# Patient Record
Sex: Male | Born: 1940 | Race: White | Hispanic: No | Marital: Married | Smoking: Never smoker
Health system: Southern US, Community
[De-identification: ages and names within clinical notes are randomized; demographics above are authoritative.]

## PROBLEM LIST (undated history)

## (undated) DIAGNOSIS — I4891 Unspecified atrial fibrillation: Secondary | ICD-10-CM

## (undated) DIAGNOSIS — E785 Hyperlipidemia, unspecified: Secondary | ICD-10-CM

## (undated) DIAGNOSIS — I10 Essential (primary) hypertension: Secondary | ICD-10-CM

## (undated) HISTORY — DX: Essential (primary) hypertension: I10

## (undated) HISTORY — PX: ATRIAL FIBRILLATION ABLATION: EP1191

## (undated) HISTORY — DX: Hyperlipidemia, unspecified: E78.5

## (undated) HISTORY — DX: Unspecified atrial fibrillation: I48.91

---

## 1993-12-02 HISTORY — PX: OTHER SURGICAL HISTORY: SHX169

## 1998-04-17 ENCOUNTER — Ambulatory Visit (HOSPITAL_COMMUNITY): Admission: RE | Admit: 1998-04-17 | Discharge: 1998-04-17 | Payer: Self-pay | Admitting: Internal Medicine

## 1998-05-10 ENCOUNTER — Ambulatory Visit (HOSPITAL_COMMUNITY): Admission: RE | Admit: 1998-05-10 | Discharge: 1998-05-10 | Payer: Self-pay | Admitting: Neurosurgery

## 1998-06-27 ENCOUNTER — Ambulatory Visit (HOSPITAL_COMMUNITY): Admission: RE | Admit: 1998-06-27 | Discharge: 1998-06-27 | Payer: Self-pay | Admitting: Neurosurgery

## 2003-03-25 ENCOUNTER — Ambulatory Visit (HOSPITAL_COMMUNITY): Admission: RE | Admit: 2003-03-25 | Discharge: 2003-03-25 | Payer: Self-pay | Admitting: Gastroenterology

## 2005-12-02 HISTORY — PX: ABLATION: SHX5711

## 2006-01-08 ENCOUNTER — Ambulatory Visit (HOSPITAL_BASED_OUTPATIENT_CLINIC_OR_DEPARTMENT_OTHER): Admission: RE | Admit: 2006-01-08 | Discharge: 2006-01-08 | Payer: Self-pay | Admitting: Surgery

## 2007-11-05 ENCOUNTER — Ambulatory Visit (HOSPITAL_COMMUNITY): Admission: RE | Admit: 2007-11-05 | Discharge: 2007-11-05 | Payer: Self-pay | Admitting: Cardiovascular Disease

## 2011-04-19 NOTE — Op Note (Signed)
   NAME:  Jeffery Garrett, Jeffery Garrett                        ACCOUNT NO.:  [id number]   MEDICAL RECORD NO.:  [id number]                   PATIENT TYPE:  AMB   LOCATION:  ENDO                                 FACILITY:  [facility]   PHYSICIAN:  John C. Madilyn Fireman, M.D.                 DATE OF BIRTH:  1940-12-09   DATE OF PROCEDURE:  03/25/2003  DATE OF DISCHARGE:                                 OPERATIVE REPORT   PROCEDURE:  Colonoscopy.   INDICATIONS FOR PROCEDURE:  History of adenomatous colon polyps with last  colonoscopy five years ago.   DESCRIPTION OF PROCEDURE:  The patient was placed in the left lateral  decubitus position and placed on the pulse monitor with continuous low-flow  oxygen delivered by nasal cannula.  He was sedated with 87.5 mcg IV fentanyl  and 8 mg IV Versed.  The Olympus video colonoscope was inserted into the  rectum and advanced to the cecum, confirmed by transillumination at  McBurney's point and visualization of the ileocecal valve and appendiceal  orifice.  The prep was excellent.  There was diffuse appearance of melanosis  coli which was fairly intense throughout the colon.  Otherwise, the cecum,  ascending, transverse, descending, and sigmoid colon all appeared normal  with no masses, polyps, diverticula, or other mucosal abnormalities.  The  rectum appeared normal, and retroflexed view of the anus revealed no obvious  internal hemorrhoids.  The colonoscope was then withdrawn, and the patient  returned to the recovery room in stable condition.  He tolerated the  procedure well, and there were no immediate complications.   IMPRESSION:  1. Melanosis coli.  2. Otherwise, normal colonoscopy.   PLAN:  Repeat colonoscopy in five years.                                               John C. Madilyn Fireman, M.D.    JCH/MEDQ  D:  03/25/2003  T:  03/25/2003  Job:  [id number]   cc:   Geoffry Paradise, M.D.  [address]  [place]  [place] [address]  Fax: [phone]

## 2011-04-19 NOTE — Op Note (Signed)
Jeffery Garrett, Jeffery Garrett              ACCOUNT NO.:  [id number]   MEDICAL RECORD NO.:  [id number]          PATIENT TYPE:  AMB   LOCATION:  DSC                          FACILITY:  [facility]   PHYSICIAN:  Currie Paris, M.D.DATE OF BIRTH:  12/22/1940   DATE OF PROCEDURE:  01/08/2006  DATE OF DISCHARGE:                                 OPERATIVE REPORT   Office Medical Record Number CCS [phone].   PREOP DIAGNOSIS:  His right inguinal hernia.   PREOPERATIVE DIAGNOSIS:  His right inguinal hernia--direct.   OPERATION:  Repair right inguinal hernia with mesh.   SURGEON:  Currie Paris, M.D.   ANESTHESIA:  MAC.   CLINICAL HISTORY:  This is an active 70 year old male who has developed a  right inguinal hernia that he desired to have repaired.   DESCRIPTION OF PROCEDURE:  The patient was seen in the holding area and he  had no further questions. He and I both marked the right inguinal area as  the operative site. He was taken to the operating room and given IV  sedation. The groin area on the right was clipped, prepped, and draped. The  time-out was done.   I infiltrated a combination of 1% Xylocaine with epinephrine mixed equally  with 0.5% Marcaine for local. I infiltrated the along the incision line; and  then at right angles to it, to make a field block; and then about a  fingerbreadth above-and-medial to the anterior superior iliac spine  subfascially.   The incision was made deepened through the external oblique aponeurosis with  bleeders either coagulated or tied with 4-0 Vicryl. Additional local was  infiltrated as we got deeper layers.  A self-retaining retractor was placed  and the external oblique identified, and opened in the line of its fibers.  The ilioinguinal nerve was noted running on top of the cord; and the cord  was dissected up off the underlying structures and surrounded with a Penrose  drain keeping the nerve with it. There was a direct defect with  preperitoneal fat protruding through. It was not very broad and reduced  easily. The cord was opened and checked; and there was no indirect sac.   I chose to imbricate the transversalis so with a running 2-0 Prolene to have  a layer of closure over the floor prior to placing a mesh patch on. The mesh  patch was then placed with it cut laterally to go around the cord. It was  sutured in starting at the pubic tubercle and then running laterally with a  running 2-0 Prolene. It was intact medially well up under the external  oblique and onto the internal oblique with several sutures of Prolene. The  tails were crossed and went well laterally to reconstruct the deep ring,  leaving adequate room for the cord structures.   Additional local was infiltrated and made sure everything was dry. The  incision was then closed in layers with 3-0 Vicryl to close the external  oblique over the cord, 3-0 Vicryl on Scarpa's, and 4-0 Monocryl plus  Dermabond for subcuticular and skin.  The patient  tolerated the procedure  well. There no operative complications. All counts were correct.      Currie Paris, M.D.  Electronically Signed     CJS/MEDQ  D:  01/08/2006  T:  01/08/2006  Job:  [id number]   cc:   Geoffry Paradise, M.D.  Fax: [phone]

## 2012-01-20 DIAGNOSIS — Z125 Encounter for screening for malignant neoplasm of prostate: Secondary | ICD-10-CM | POA: Diagnosis not present

## 2012-01-20 DIAGNOSIS — E039 Hypothyroidism, unspecified: Secondary | ICD-10-CM | POA: Diagnosis not present

## 2012-01-20 DIAGNOSIS — Z0000 Encounter for general adult medical examination without abnormal findings: Secondary | ICD-10-CM | POA: Diagnosis not present

## 2012-01-20 DIAGNOSIS — I10 Essential (primary) hypertension: Secondary | ICD-10-CM | POA: Diagnosis not present

## 2012-01-20 DIAGNOSIS — E785 Hyperlipidemia, unspecified: Secondary | ICD-10-CM | POA: Diagnosis not present

## 2012-01-27 DIAGNOSIS — Z125 Encounter for screening for malignant neoplasm of prostate: Secondary | ICD-10-CM | POA: Diagnosis not present

## 2012-01-27 DIAGNOSIS — I10 Essential (primary) hypertension: Secondary | ICD-10-CM | POA: Diagnosis not present

## 2012-01-27 DIAGNOSIS — E039 Hypothyroidism, unspecified: Secondary | ICD-10-CM | POA: Diagnosis not present

## 2012-01-27 DIAGNOSIS — Z0000 Encounter for general adult medical examination without abnormal findings: Secondary | ICD-10-CM | POA: Diagnosis not present

## 2012-01-27 DIAGNOSIS — I491 Atrial premature depolarization: Secondary | ICD-10-CM | POA: Diagnosis not present

## 2012-01-28 DIAGNOSIS — Z1212 Encounter for screening for malignant neoplasm of rectum: Secondary | ICD-10-CM | POA: Diagnosis not present

## 2012-04-23 DIAGNOSIS — C44721 Squamous cell carcinoma of skin of unspecified lower limb, including hip: Secondary | ICD-10-CM | POA: Diagnosis not present

## 2012-04-23 DIAGNOSIS — Z85828 Personal history of other malignant neoplasm of skin: Secondary | ICD-10-CM | POA: Diagnosis not present

## 2012-04-23 DIAGNOSIS — L821 Other seborrheic keratosis: Secondary | ICD-10-CM | POA: Diagnosis not present

## 2012-06-01 DIAGNOSIS — C44721 Squamous cell carcinoma of skin of unspecified lower limb, including hip: Secondary | ICD-10-CM | POA: Diagnosis not present

## 2012-08-19 DIAGNOSIS — L259 Unspecified contact dermatitis, unspecified cause: Secondary | ICD-10-CM | POA: Diagnosis not present

## 2012-08-19 DIAGNOSIS — D235 Other benign neoplasm of skin of trunk: Secondary | ICD-10-CM | POA: Diagnosis not present

## 2012-08-19 DIAGNOSIS — Z85828 Personal history of other malignant neoplasm of skin: Secondary | ICD-10-CM | POA: Diagnosis not present

## 2012-08-28 DIAGNOSIS — Z23 Encounter for immunization: Secondary | ICD-10-CM | POA: Diagnosis not present

## 2012-12-11 DIAGNOSIS — H18219 Corneal edema secondary to contact lens, unspecified eye: Secondary | ICD-10-CM | POA: Diagnosis not present

## 2012-12-11 DIAGNOSIS — H16009 Unspecified corneal ulcer, unspecified eye: Secondary | ICD-10-CM | POA: Diagnosis not present

## 2012-12-18 DIAGNOSIS — H18219 Corneal edema secondary to contact lens, unspecified eye: Secondary | ICD-10-CM | POA: Diagnosis not present

## 2012-12-18 DIAGNOSIS — H16009 Unspecified corneal ulcer, unspecified eye: Secondary | ICD-10-CM | POA: Diagnosis not present

## 2013-02-01 DIAGNOSIS — E785 Hyperlipidemia, unspecified: Secondary | ICD-10-CM | POA: Diagnosis not present

## 2013-02-01 DIAGNOSIS — E039 Hypothyroidism, unspecified: Secondary | ICD-10-CM | POA: Diagnosis not present

## 2013-05-05 DIAGNOSIS — I10 Essential (primary) hypertension: Secondary | ICD-10-CM | POA: Diagnosis not present

## 2013-05-05 DIAGNOSIS — Z8679 Personal history of other diseases of the circulatory system: Secondary | ICD-10-CM | POA: Diagnosis not present

## 2013-05-05 DIAGNOSIS — E785 Hyperlipidemia, unspecified: Secondary | ICD-10-CM | POA: Diagnosis not present

## 2013-05-18 DIAGNOSIS — Z86010 Personal history of colonic polyps: Secondary | ICD-10-CM | POA: Diagnosis not present

## 2013-05-18 DIAGNOSIS — D126 Benign neoplasm of colon, unspecified: Secondary | ICD-10-CM | POA: Diagnosis not present

## 2013-05-18 DIAGNOSIS — K5730 Diverticulosis of large intestine without perforation or abscess without bleeding: Secondary | ICD-10-CM | POA: Diagnosis not present

## 2013-05-18 DIAGNOSIS — K6389 Other specified diseases of intestine: Secondary | ICD-10-CM | POA: Diagnosis not present

## 2013-05-18 DIAGNOSIS — Z09 Encounter for follow-up examination after completed treatment for conditions other than malignant neoplasm: Secondary | ICD-10-CM | POA: Diagnosis not present

## 2013-05-24 DIAGNOSIS — H04129 Dry eye syndrome of unspecified lacrimal gland: Secondary | ICD-10-CM | POA: Diagnosis not present

## 2013-09-11 DIAGNOSIS — Z23 Encounter for immunization: Secondary | ICD-10-CM | POA: Diagnosis not present

## 2013-11-03 DIAGNOSIS — Z85828 Personal history of other malignant neoplasm of skin: Secondary | ICD-10-CM | POA: Diagnosis not present

## 2013-11-03 DIAGNOSIS — L708 Other acne: Secondary | ICD-10-CM | POA: Diagnosis not present

## 2013-11-03 DIAGNOSIS — D235 Other benign neoplasm of skin of trunk: Secondary | ICD-10-CM | POA: Diagnosis not present

## 2014-01-31 DIAGNOSIS — E039 Hypothyroidism, unspecified: Secondary | ICD-10-CM | POA: Diagnosis not present

## 2014-01-31 DIAGNOSIS — Z125 Encounter for screening for malignant neoplasm of prostate: Secondary | ICD-10-CM | POA: Diagnosis not present

## 2014-01-31 DIAGNOSIS — I10 Essential (primary) hypertension: Secondary | ICD-10-CM | POA: Diagnosis not present

## 2014-01-31 DIAGNOSIS — E785 Hyperlipidemia, unspecified: Secondary | ICD-10-CM | POA: Diagnosis not present

## 2014-02-07 DIAGNOSIS — I10 Essential (primary) hypertension: Secondary | ICD-10-CM | POA: Diagnosis not present

## 2014-02-07 DIAGNOSIS — E039 Hypothyroidism, unspecified: Secondary | ICD-10-CM | POA: Diagnosis not present

## 2014-02-07 DIAGNOSIS — Z125 Encounter for screening for malignant neoplasm of prostate: Secondary | ICD-10-CM | POA: Diagnosis not present

## 2014-02-07 DIAGNOSIS — E785 Hyperlipidemia, unspecified: Secondary | ICD-10-CM | POA: Diagnosis not present

## 2014-02-07 DIAGNOSIS — Z0000 Encounter for general adult medical examination without abnormal findings: Secondary | ICD-10-CM | POA: Diagnosis not present

## 2014-02-07 DIAGNOSIS — IMO0002 Reserved for concepts with insufficient information to code with codable children: Secondary | ICD-10-CM | POA: Diagnosis not present

## 2014-02-07 DIAGNOSIS — Z23 Encounter for immunization: Secondary | ICD-10-CM | POA: Diagnosis not present

## 2014-02-07 DIAGNOSIS — Z1331 Encounter for screening for depression: Secondary | ICD-10-CM | POA: Diagnosis not present

## 2014-02-08 DIAGNOSIS — Z1212 Encounter for screening for malignant neoplasm of rectum: Secondary | ICD-10-CM | POA: Diagnosis not present

## 2014-04-26 DIAGNOSIS — D235 Other benign neoplasm of skin of trunk: Secondary | ICD-10-CM | POA: Diagnosis not present

## 2014-04-26 DIAGNOSIS — L678 Other hair color and hair shaft abnormalities: Secondary | ICD-10-CM | POA: Diagnosis not present

## 2014-04-26 DIAGNOSIS — L738 Other specified follicular disorders: Secondary | ICD-10-CM | POA: Diagnosis not present

## 2014-04-26 DIAGNOSIS — B86 Scabies: Secondary | ICD-10-CM | POA: Diagnosis not present

## 2014-05-04 DIAGNOSIS — Z8679 Personal history of other diseases of the circulatory system: Secondary | ICD-10-CM | POA: Diagnosis not present

## 2014-05-04 DIAGNOSIS — I10 Essential (primary) hypertension: Secondary | ICD-10-CM | POA: Diagnosis not present

## 2014-05-04 DIAGNOSIS — E785 Hyperlipidemia, unspecified: Secondary | ICD-10-CM | POA: Diagnosis not present

## 2014-05-04 DIAGNOSIS — Z9889 Other specified postprocedural states: Secondary | ICD-10-CM | POA: Diagnosis not present

## 2014-06-27 DIAGNOSIS — I10 Essential (primary) hypertension: Secondary | ICD-10-CM | POA: Diagnosis not present

## 2014-06-27 DIAGNOSIS — L259 Unspecified contact dermatitis, unspecified cause: Secondary | ICD-10-CM | POA: Diagnosis not present

## 2014-06-27 DIAGNOSIS — IMO0002 Reserved for concepts with insufficient information to code with codable children: Secondary | ICD-10-CM | POA: Diagnosis not present

## 2014-07-22 DIAGNOSIS — H2510 Age-related nuclear cataract, unspecified eye: Secondary | ICD-10-CM | POA: Diagnosis not present

## 2014-07-27 DIAGNOSIS — L738 Other specified follicular disorders: Secondary | ICD-10-CM | POA: Diagnosis not present

## 2014-07-27 DIAGNOSIS — B86 Scabies: Secondary | ICD-10-CM | POA: Diagnosis not present

## 2014-07-27 DIAGNOSIS — D485 Neoplasm of uncertain behavior of skin: Secondary | ICD-10-CM | POA: Diagnosis not present

## 2014-07-27 DIAGNOSIS — L678 Other hair color and hair shaft abnormalities: Secondary | ICD-10-CM | POA: Diagnosis not present

## 2014-08-24 DIAGNOSIS — I10 Essential (primary) hypertension: Secondary | ICD-10-CM | POA: Diagnosis not present

## 2014-08-29 ENCOUNTER — Ambulatory Visit: Payer: Medicare Other | Admitting: Neurology

## 2014-08-30 ENCOUNTER — Encounter (INDEPENDENT_AMBULATORY_CARE_PROVIDER_SITE_OTHER): Payer: Self-pay

## 2014-08-30 ENCOUNTER — Encounter: Payer: Self-pay | Admitting: Neurology

## 2014-08-30 ENCOUNTER — Ambulatory Visit (INDEPENDENT_AMBULATORY_CARE_PROVIDER_SITE_OTHER): Payer: Medicare Other | Admitting: Neurology

## 2014-08-30 VITALS — BP 129/73 | HR 73 | Temp 98.2°F | Ht 66.0 in | Wt 147.0 lb

## 2014-08-30 DIAGNOSIS — I4891 Unspecified atrial fibrillation: Secondary | ICD-10-CM

## 2014-08-30 DIAGNOSIS — I480 Paroxysmal atrial fibrillation: Secondary | ICD-10-CM

## 2014-08-30 DIAGNOSIS — G252 Other specified forms of tremor: Secondary | ICD-10-CM

## 2014-08-30 DIAGNOSIS — G250 Essential tremor: Secondary | ICD-10-CM | POA: Diagnosis not present

## 2014-08-30 MED ORDER — PRIMIDONE 50 MG PO TABS: ORAL_TABLET | ORAL | Status: DC

## 2014-08-30 NOTE — Progress Notes (Signed)
Subjective:    Patient ID: Jeffery Garrett is a 73 y.o. male.  HPI    Star Age, MD, PhD [facility] [address] [place], [place] [address]  Dear Ulice Dash,   I saw your patient, Jeffery Garrett, upon your kind request in my neurologic clinic today for initial consultation of his tremors. The patient is unaccompanied today. As you know, Jeffery Garrett is a very friendly 73 year old right-handed gentleman with an underlying medical history of hypertension, hyperlipidemia, and atrial fibrillation status post ablation in 2008 and on chronic flecainide therapy, who reports a 10-15 year history of bilateral upper extremity tremors, R more than L. He had seen his PCP about this several years ago, and was told, he had no parkinsonism and he has not tried medications for the tremor. It has been mildly progressive, worse with stress or after exercise.   His Past Medical History Is Significant For: Past Medical History  Diagnosis Date  . Hypertension     His Past Surgical History Is Significant For: Past Surgical History  Procedure Laterality Date  . Neck fusion  1995  . Ablation  2007    His Family History Is Significant For: History reviewed. No pertinent family history.  His Social History Is Significant For: History   Social History  . Marital Status: Married    Spouse Name: N/A    Number of Children: N/A  . Years of Education: N/A   Social History Main Topics  . Smoking status: Never Smoker   . Smokeless tobacco: Never Used  . Alcohol Use: No  . Drug Use: No  . Sexual Activity: None   Other Topics Concern  . None   Social History Narrative  . None    His Allergies Are:  No Known Allergies:   His Current Medications Are:  Outpatient Encounter Prescriptions as of 08/30/2014  Medication Sig  . amLODipine (NORVASC) 5 MG tablet   . aspirin 81 MG tablet Take 81 mg by mouth daily.  Marland Kitchen doxycycline (VIBRAMYCIN) 100 MG capsule   .  flecainide (TAMBOCOR) 50 MG tablet   . permethrin (ELIMITE) 5 % cream   . rosuvastatin (CRESTOR) 5 MG tablet Take 5 mg by mouth daily.  Marland Kitchen SYNTHROID 100 MCG tablet   . [DISCONTINUED] triamcinolone cream (KENALOG) 0.1 %   :   Review of Systems:  Out of a complete 14 point review of systems, all are reviewed and negative with the exception of these symptoms as listed below:   Review of Systems  HENT:       Ringing in ears  Musculoskeletal:       Joint pain,cramps  Neurological: Positive for tremors.    Objective:  Neurologic Exam  Physical Exam  Assessment:   Physical Examination:   Filed Vitals:   08/30/14 1333  BP: 129/73  Pulse: 73  Temp: 98.2 F (36.8 C)    General Examination: The patient is a very pleasant 72 y.o. male in no acute distress. He appears well-developed and well-nourished and well groomed.   HEENT: Normocephalic, atraumatic, pupils are equal, round and reactive to light and accommodation. He has mild b/l cataracts. Funduscopic exam is normal with sharp disc margins noted. Extraocular tracking is good without limitation to gaze excursion or nystagmus noted. Normal smooth pursuit is noted. Hearing is grossly intact. Tympanic membranes are clear bilaterally. Face is symmetric with normal facial animation and normal facial sensation. Speech is clear with no dysarthria noted. There is  no hypophonia. There is no lip, neck/head, jaw or voice tremor. Neck is supple with full range of passive and active motion. There are no carotid bruits on auscultation. Oropharynx exam reveals: mild mouth dryness, adequate dental hygiene and mild airway crowding, due to redundant soft palate. Mallampati is class II. Tongue protrudes centrally and palate elevates symmetrically.   Chest: Clear to auscultation without wheezing, rhonchi or crackles noted.  Heart: S1+S2+0, regular and normal without murmurs, rubs or gallops noted.   Abdomen: Soft, non-tender and non-distended with  normal bowel sounds appreciated on auscultation.  Extremities: There is no pitting edema in the distal lower extremities bilaterally. Pedal pulses are intact.  Skin: Warm and dry without trophic changes noted. There are no varicose veins.  Musculoskeletal: exam reveals no obvious joint deformities, tenderness or joint swelling or erythema.   Neurologically:  Mental status: The patient is awake, alert and oriented in all 4 spheres. His immediate and remote memory, attention, language skills and fund of knowledge are appropriate. There is no evidence of aphasia, agnosia, apraxia or anomia. Speech is clear with normal prosody and enunciation. Thought process is linear. Mood is normal and affect is normal.  Cranial nerves II - XII are as described above under HEENT exam. In addition: shoulder shrug is normal with equal shoulder height noted. Motor exam: Normal bulk, strength and tone is noted. There is no drift, or rebound. There is no resting tremor. There is a bilateral upper extremity postural and action tremor, which is minimal in degree on the L and mild on the R. There tremor frequency is fairly fast and the amplitude is small. On Archimedes spiral drawing there is mild tremulousness noted. Handwriting is mildly to moderately tremulous, but legible. There is no evidence of micrographia.  Romberg is negative. Reflexes are 2+ throughout. Babinski: Toes are flexor bilaterally. Fine motor skills and coordination: intact with normal finger taps, normal hand movements, normal rapid alternating patting, normal foot taps and normal foot agility.  Cerebellar testing: No dysmetria or intention tremor on finger to nose testing. Heel to shin is unremarkable bilaterally. There is no truncal or gait ataxia.  Sensory exam: intact to light touch, pinprick, vibration, temperature sense in the upper and lower extremities.  Gait, station and balance: He stands easily. No veering to one side is noted. No leaning to one  side is noted. Posture is age-appropriate and stance is narrow based. Gait shows normal stride length and normal pace. No problems turning are noted. He turns en bloc. Tandem walk is unremarkable. Intact toe and heel stance is noted.               Assessment and Plan:    In summary, Jeffery Garrett is a very pleasant 73 y.o.-year old male with an underlying medical history of hypertension, hyperlipidemia, and atrial fibrillation status post ablation in 2008 and on chronic flecainide therapy, who reports a 10-15 year history of bilateral upper extremity tremors, R more than L. 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 He is reassured, that he has no other focal neurologic findings. He has overall mild tremor.  I had a long chat with the patient about my findings and the diagnosis of ET, the prognosis and treatment options. We talked about medical treatments and non-pharmacological approaches. We talked about maintaining a healthy lifestyle in general. I encouraged the patient to eat healthy, exercise daily and keep well hydrated, to keep a scheduled  bedtime and wake time routine, to not skip any meals and eat healthy snacks in between meals and to have protein with every meal. He is advised about tremor triggers: any kind of tremor may be exacerbated by anxiety, anger, nervousness, excitement, dehydration, sleep deprivation, by caffeine, and low blood sugar values or blood sugar fluctuations. Some medications, especially some antidepressants and lithium can cause or exacerbate tremors. Tremors may temporarily calm down or subside with the use of a benzodiazepine such as Valium or related medications and with alcohol. Drinking alcohol is not an approved treatment or appropriate treatment for tremor control and long-term use of benzodiazepines such as Valium, lorazepam, alprazolam, or clonazepam can cause habit formation, physical and psychological  addiction. I do not think we need to do further testing. His thyroid function is followed by his PCP.   As far as medications are concerned, I recommended the following at this time: He is advised that symptomatic treatment with a beta blocker may lower his BP and pulse, so I would avoid it. However, we may be able to get symptomatic improvement with a low dose of Mysoline. To that end I have asked him to start prednisone 50 mg strength half a pill at bedtime each day for a week and then gradually increase it up to 2 pills each night. We talked about potential side effects in particular sedation, and balance problems. Usually it is a very well-tolerated medication I advised him. I provided him with a prescription as well as written instructions.  I answered all his questions today and the patient was in agreement with the above outlined plan. I would like to see the patient back in 6 months, sooner if the need arises and encouraged him to call with any interim questions, concerns, problems or updates and refill requests.  Thank you very much for allowing me to participate in the care of this nice patient. If I can be of any further assistance to you please do not hesitate to call me at [phone].  Sincerely,   Star Age, MD, PhD

## 2014-08-30 NOTE — Patient Instructions (Addendum)
  Please remember, that any kind of tremor may be exacerbated by anxiety, anger, nervousness, excitement, dehydration, sleep deprivation, by caffeine, and low blood sugar values or blood sugar fluctuations. Some medications, especially some antidepressants and lithium can cause or exacerbate tremors. Tremors may temporarily calm down her subside with the use of a benzodiazepine such as Valium or related medications and with alcohol. Be aware however that drinking alcohol is not an approved treatment or appropriate treatment for tremor control and long-term use of benzodiazepines such as Valium, lorazepam, alprazolam, or clonazepam can cause habit formation, physical and psychological addiction.  For your tremor, we will try Mysoline (primidone) 50 mg strength: Take 1/2 pill each bedtime for 1 week, then 1 pill each bedtime for 1 week, then 1 1/2 pills each bedtime for 1 week, then 2 pills each bedtime thereafter. Common side effects reported are: Sleepiness, drowsiness, balance problems, confusion, and GI related symptoms.

## 2014-09-12 DIAGNOSIS — Z23 Encounter for immunization: Secondary | ICD-10-CM | POA: Diagnosis not present

## 2014-09-14 DIAGNOSIS — I4891 Unspecified atrial fibrillation: Secondary | ICD-10-CM | POA: Diagnosis not present

## 2014-09-14 DIAGNOSIS — I10 Essential (primary) hypertension: Secondary | ICD-10-CM | POA: Diagnosis not present

## 2014-09-19 DIAGNOSIS — I10 Essential (primary) hypertension: Secondary | ICD-10-CM | POA: Diagnosis not present

## 2014-09-21 DIAGNOSIS — D485 Neoplasm of uncertain behavior of skin: Secondary | ICD-10-CM | POA: Diagnosis not present

## 2014-09-21 DIAGNOSIS — C44619 Basal cell carcinoma of skin of left upper limb, including shoulder: Secondary | ICD-10-CM | POA: Diagnosis not present

## 2014-09-21 DIAGNOSIS — L309 Dermatitis, unspecified: Secondary | ICD-10-CM | POA: Diagnosis not present

## 2014-09-21 DIAGNOSIS — L308 Other specified dermatitis: Secondary | ICD-10-CM | POA: Diagnosis not present

## 2014-10-13 ENCOUNTER — Ambulatory Visit (INDEPENDENT_AMBULATORY_CARE_PROVIDER_SITE_OTHER): Payer: Medicare Other | Admitting: Family Medicine

## 2014-10-13 VITALS — BP 108/62 | HR 60 | Temp 98.3°F | Resp 18 | Ht 66.0 in | Wt 147.0 lb

## 2014-10-13 DIAGNOSIS — M79605 Pain in left leg: Secondary | ICD-10-CM

## 2014-10-13 DIAGNOSIS — S81812A Laceration without foreign body, left lower leg, initial encounter: Secondary | ICD-10-CM

## 2014-10-13 NOTE — Progress Notes (Signed)
   Subjective:    Patient ID: Jeffery Garrett, male    DOB: 1941-06-10, 73 y.o.   MRN: [id number]  HPI Pt presents to clinic with a laceration to his left shin that occurred when the ladder he was standing on slipped and his foot slipped between the rungs and he got a cut.  His tetanus is updated through his PCP.  He did not hit his head and his back is slightly sore but he does not want [place] to deal with that today because he plans to take tylenol and motrin for his stiffness.  Review of Systems     Objective:   Physical Exam  Constitutional: He is oriented to person, place, and time. He appears well-developed and well-nourished.  BP 108/62 mmHg  Pulse 60  Temp(Src) 98.3 F (36.8 C) (Oral)  Resp 18  Ht 5\' 6"  (1.676 m)  Wt 147 lb (66.679 kg)  BMI 23.74 kg/m2  SpO2 96%   HENT:  Head: Normocephalic and atraumatic.  Right Ear: External ear normal.  Left Ear: External ear normal.  Pulmonary/Chest: Effort normal.  Musculoskeletal:       Left lower leg: He exhibits no tenderness and no bony tenderness.  Good ankle ROM and strength on the left side.   Neurological: He is alert and oriented to person, place, and time.  Skin: Skin is warm and dry.     Psychiatric: He has a normal mood and affect. His behavior is normal. Judgment and thought content normal.  Vitals reviewed.  Procedure:  Consent obtained.  Local anesthesia with 2% lido.  Wound cleaned and explored.  (See media for photo).  There is a nerve visible but no damage seen.  The muscle sheath is not disrupted and no tendon anatomy is seen.  Wound closed with 4-0 Ethilon #15 (?) sutures (simple and horizontal).  The superficial aspect of the wound is a skin tear and closed with steri-strips.  There are some over laying skin tears on the skin flap that are also held together with steri-strips. Drsg applied.       Assessment & Plan:  Laceration of leg excluding thigh, left, initial encounter Wound closed.  Due to the location  and the activity level of the patient he will keep the stitches for 14d.  He will f/u if there are problems before then.  He will restricted activity for the next 4-5 days due to the tension on the wound to allow for some healing.   Windell Hummingbird PA-C  [facility] 10/13/2014 6:35 PM

## 2014-10-13 NOTE — Patient Instructions (Signed)
WOUND CARE Please return in 14 days to have your stitches/staples removed or sooner if you have concerns.  Keep area clean and dry for 24 hours. Do not remove bandage, if applied.  After 24 hours, remove bandage and wash wound gently with mild soap and warm water. Reapply a new bandage after cleaning wound, if directed.  Continue daily cleansing with soap and water until stitches/staples are removed.  Do not apply any ointments or creams to the wound while stitches/staples are in place, as this may cause delayed healing.  Notify the office if you experience any of the following signs of infection: Swelling, redness, pus drainage, streaking, fever >101.0 F  Notify the office if you experience excessive bleeding that does not stop after 15-20 minutes of constant, firm pressure.  

## 2014-10-15 NOTE — Progress Notes (Signed)
Patient discussed and examined with Ms. Weber. Agree with assessment and plan of care per her note.

## 2015-01-11 DIAGNOSIS — Z6823 Body mass index (BMI) 23.0-23.9, adult: Secondary | ICD-10-CM | POA: Diagnosis not present

## 2015-01-11 DIAGNOSIS — M545 Low back pain: Secondary | ICD-10-CM | POA: Diagnosis not present

## 2015-01-11 DIAGNOSIS — M4856XA Collapsed vertebra, not elsewhere classified, lumbar region, initial encounter for fracture: Secondary | ICD-10-CM | POA: Diagnosis not present

## 2015-01-11 DIAGNOSIS — M5136 Other intervertebral disc degeneration, lumbar region: Secondary | ICD-10-CM | POA: Diagnosis not present

## 2015-01-11 DIAGNOSIS — N644 Mastodynia: Secondary | ICD-10-CM | POA: Diagnosis not present

## 2015-01-11 DIAGNOSIS — M4317 Spondylolisthesis, lumbosacral region: Secondary | ICD-10-CM | POA: Diagnosis not present

## 2015-02-08 DIAGNOSIS — I10 Essential (primary) hypertension: Secondary | ICD-10-CM | POA: Diagnosis not present

## 2015-02-08 DIAGNOSIS — Z125 Encounter for screening for malignant neoplasm of prostate: Secondary | ICD-10-CM | POA: Diagnosis not present

## 2015-02-08 DIAGNOSIS — E785 Hyperlipidemia, unspecified: Secondary | ICD-10-CM | POA: Diagnosis not present

## 2015-02-08 DIAGNOSIS — E039 Hypothyroidism, unspecified: Secondary | ICD-10-CM | POA: Diagnosis not present

## 2015-02-13 DIAGNOSIS — N183 Chronic kidney disease, stage 3 (moderate): Secondary | ICD-10-CM | POA: Diagnosis not present

## 2015-02-13 DIAGNOSIS — E039 Hypothyroidism, unspecified: Secondary | ICD-10-CM | POA: Diagnosis not present

## 2015-02-13 DIAGNOSIS — Z1389 Encounter for screening for other disorder: Secondary | ICD-10-CM | POA: Diagnosis not present

## 2015-02-13 DIAGNOSIS — I10 Essential (primary) hypertension: Secondary | ICD-10-CM | POA: Diagnosis not present

## 2015-02-13 DIAGNOSIS — I129 Hypertensive chronic kidney disease with stage 1 through stage 4 chronic kidney disease, or unspecified chronic kidney disease: Secondary | ICD-10-CM | POA: Diagnosis not present

## 2015-02-13 DIAGNOSIS — E785 Hyperlipidemia, unspecified: Secondary | ICD-10-CM | POA: Diagnosis not present

## 2015-02-13 DIAGNOSIS — M5380 Other specified dorsopathies, site unspecified: Secondary | ICD-10-CM | POA: Diagnosis not present

## 2015-02-13 DIAGNOSIS — Z0000 Encounter for general adult medical examination without abnormal findings: Secondary | ICD-10-CM | POA: Diagnosis not present

## 2015-02-13 DIAGNOSIS — Z6822 Body mass index (BMI) 22.0-22.9, adult: Secondary | ICD-10-CM | POA: Diagnosis not present

## 2015-02-13 DIAGNOSIS — I491 Atrial premature depolarization: Secondary | ICD-10-CM | POA: Diagnosis not present

## 2015-02-13 DIAGNOSIS — Z125 Encounter for screening for malignant neoplasm of prostate: Secondary | ICD-10-CM | POA: Diagnosis not present

## 2015-02-14 ENCOUNTER — Telehealth: Payer: Self-pay

## 2015-02-14 NOTE — Telephone Encounter (Signed)
Left messages on both numbers to call back and reschedule appt on 3/29.

## 2015-02-15 DIAGNOSIS — Z1212 Encounter for screening for malignant neoplasm of rectum: Secondary | ICD-10-CM | POA: Diagnosis not present

## 2015-02-17 NOTE — Telephone Encounter (Signed)
Left message again on both numbers to call back and reschedule appt

## 2015-02-20 DIAGNOSIS — H04123 Dry eye syndrome of bilateral lacrimal glands: Secondary | ICD-10-CM | POA: Diagnosis not present

## 2015-02-28 ENCOUNTER — Ambulatory Visit: Payer: Medicare Other | Admitting: Neurology

## 2015-05-03 DIAGNOSIS — E785 Hyperlipidemia, unspecified: Secondary | ICD-10-CM | POA: Diagnosis not present

## 2015-05-03 DIAGNOSIS — Z8679 Personal history of other diseases of the circulatory system: Secondary | ICD-10-CM | POA: Diagnosis not present

## 2015-05-03 DIAGNOSIS — I10 Essential (primary) hypertension: Secondary | ICD-10-CM | POA: Diagnosis not present

## 2015-05-19 DIAGNOSIS — L739 Follicular disorder, unspecified: Secondary | ICD-10-CM | POA: Diagnosis not present

## 2015-09-13 DIAGNOSIS — I10 Essential (primary) hypertension: Secondary | ICD-10-CM | POA: Diagnosis not present

## 2015-09-13 DIAGNOSIS — M5380 Other specified dorsopathies, site unspecified: Secondary | ICD-10-CM | POA: Diagnosis not present

## 2015-09-13 DIAGNOSIS — Z6822 Body mass index (BMI) 22.0-22.9, adult: Secondary | ICD-10-CM | POA: Diagnosis not present

## 2015-09-13 DIAGNOSIS — I129 Hypertensive chronic kidney disease with stage 1 through stage 4 chronic kidney disease, or unspecified chronic kidney disease: Secondary | ICD-10-CM | POA: Diagnosis not present

## 2015-09-13 DIAGNOSIS — Z23 Encounter for immunization: Secondary | ICD-10-CM | POA: Diagnosis not present

## 2015-09-13 DIAGNOSIS — E039 Hypothyroidism, unspecified: Secondary | ICD-10-CM | POA: Diagnosis not present

## 2015-09-13 DIAGNOSIS — N183 Chronic kidney disease, stage 3 (moderate): Secondary | ICD-10-CM | POA: Diagnosis not present

## 2015-09-13 DIAGNOSIS — E785 Hyperlipidemia, unspecified: Secondary | ICD-10-CM | POA: Diagnosis not present

## 2015-11-10 DIAGNOSIS — E785 Hyperlipidemia, unspecified: Secondary | ICD-10-CM | POA: Diagnosis not present

## 2015-11-10 DIAGNOSIS — I10 Essential (primary) hypertension: Secondary | ICD-10-CM | POA: Diagnosis not present

## 2015-11-10 DIAGNOSIS — Z8679 Personal history of other diseases of the circulatory system: Secondary | ICD-10-CM | POA: Diagnosis not present

## 2015-11-14 DIAGNOSIS — R011 Cardiac murmur, unspecified: Secondary | ICD-10-CM | POA: Diagnosis not present

## 2015-11-14 DIAGNOSIS — I10 Essential (primary) hypertension: Secondary | ICD-10-CM | POA: Diagnosis not present

## 2015-12-18 DIAGNOSIS — L821 Other seborrheic keratosis: Secondary | ICD-10-CM | POA: Diagnosis not present

## 2015-12-18 DIAGNOSIS — D485 Neoplasm of uncertain behavior of skin: Secondary | ICD-10-CM | POA: Diagnosis not present

## 2015-12-18 DIAGNOSIS — L570 Actinic keratosis: Secondary | ICD-10-CM | POA: Diagnosis not present

## 2016-02-29 DIAGNOSIS — E784 Other hyperlipidemia: Secondary | ICD-10-CM | POA: Diagnosis not present

## 2016-02-29 DIAGNOSIS — I10 Essential (primary) hypertension: Secondary | ICD-10-CM | POA: Diagnosis not present

## 2016-02-29 DIAGNOSIS — E038 Other specified hypothyroidism: Secondary | ICD-10-CM | POA: Diagnosis not present

## 2016-02-29 DIAGNOSIS — Z125 Encounter for screening for malignant neoplasm of prostate: Secondary | ICD-10-CM | POA: Diagnosis not present

## 2016-03-08 DIAGNOSIS — Z6822 Body mass index (BMI) 22.0-22.9, adult: Secondary | ICD-10-CM | POA: Diagnosis not present

## 2016-03-08 DIAGNOSIS — Z0000 Encounter for general adult medical examination without abnormal findings: Secondary | ICD-10-CM | POA: Diagnosis not present

## 2016-03-08 DIAGNOSIS — I491 Atrial premature depolarization: Secondary | ICD-10-CM | POA: Diagnosis not present

## 2016-03-08 DIAGNOSIS — I10 Essential (primary) hypertension: Secondary | ICD-10-CM | POA: Diagnosis not present

## 2016-03-08 DIAGNOSIS — Z125 Encounter for screening for malignant neoplasm of prostate: Secondary | ICD-10-CM | POA: Diagnosis not present

## 2016-03-08 DIAGNOSIS — M545 Low back pain: Secondary | ICD-10-CM | POA: Diagnosis not present

## 2016-03-08 DIAGNOSIS — Z1389 Encounter for screening for other disorder: Secondary | ICD-10-CM | POA: Diagnosis not present

## 2016-03-08 DIAGNOSIS — E038 Other specified hypothyroidism: Secondary | ICD-10-CM | POA: Diagnosis not present

## 2016-03-08 DIAGNOSIS — N183 Chronic kidney disease, stage 3 (moderate): Secondary | ICD-10-CM | POA: Diagnosis not present

## 2016-03-08 DIAGNOSIS — M5380 Other specified dorsopathies, site unspecified: Secondary | ICD-10-CM | POA: Diagnosis not present

## 2016-03-08 DIAGNOSIS — E784 Other hyperlipidemia: Secondary | ICD-10-CM | POA: Diagnosis not present

## 2016-03-08 DIAGNOSIS — I129 Hypertensive chronic kidney disease with stage 1 through stage 4 chronic kidney disease, or unspecified chronic kidney disease: Secondary | ICD-10-CM | POA: Diagnosis not present

## 2016-03-22 DIAGNOSIS — Z1212 Encounter for screening for malignant neoplasm of rectum: Secondary | ICD-10-CM | POA: Diagnosis not present

## 2016-04-17 DIAGNOSIS — M25561 Pain in right knee: Secondary | ICD-10-CM | POA: Diagnosis not present

## 2016-04-17 DIAGNOSIS — M25562 Pain in left knee: Secondary | ICD-10-CM | POA: Diagnosis not present

## 2016-04-17 DIAGNOSIS — M170 Bilateral primary osteoarthritis of knee: Secondary | ICD-10-CM | POA: Diagnosis not present

## 2016-04-23 DIAGNOSIS — M25562 Pain in left knee: Secondary | ICD-10-CM | POA: Diagnosis not present

## 2016-04-23 DIAGNOSIS — M1712 Unilateral primary osteoarthritis, left knee: Secondary | ICD-10-CM | POA: Diagnosis not present

## 2016-04-24 DIAGNOSIS — M1711 Unilateral primary osteoarthritis, right knee: Secondary | ICD-10-CM | POA: Diagnosis not present

## 2016-04-24 DIAGNOSIS — M25561 Pain in right knee: Secondary | ICD-10-CM | POA: Diagnosis not present

## 2016-05-01 DIAGNOSIS — M25562 Pain in left knee: Secondary | ICD-10-CM | POA: Diagnosis not present

## 2016-05-01 DIAGNOSIS — M1712 Unilateral primary osteoarthritis, left knee: Secondary | ICD-10-CM | POA: Diagnosis not present

## 2016-05-02 DIAGNOSIS — M25561 Pain in right knee: Secondary | ICD-10-CM | POA: Diagnosis not present

## 2016-05-02 DIAGNOSIS — M1711 Unilateral primary osteoarthritis, right knee: Secondary | ICD-10-CM | POA: Diagnosis not present

## 2016-05-07 DIAGNOSIS — M1712 Unilateral primary osteoarthritis, left knee: Secondary | ICD-10-CM | POA: Diagnosis not present

## 2016-05-07 DIAGNOSIS — M25562 Pain in left knee: Secondary | ICD-10-CM | POA: Diagnosis not present

## 2016-05-08 DIAGNOSIS — M25561 Pain in right knee: Secondary | ICD-10-CM | POA: Diagnosis not present

## 2016-05-08 DIAGNOSIS — M1711 Unilateral primary osteoarthritis, right knee: Secondary | ICD-10-CM | POA: Diagnosis not present

## 2016-05-15 DIAGNOSIS — M25561 Pain in right knee: Secondary | ICD-10-CM | POA: Diagnosis not present

## 2016-05-15 DIAGNOSIS — I10 Essential (primary) hypertension: Secondary | ICD-10-CM | POA: Diagnosis not present

## 2016-05-15 DIAGNOSIS — Z8679 Personal history of other diseases of the circulatory system: Secondary | ICD-10-CM | POA: Diagnosis not present

## 2016-05-15 DIAGNOSIS — E785 Hyperlipidemia, unspecified: Secondary | ICD-10-CM | POA: Diagnosis not present

## 2016-05-15 DIAGNOSIS — M170 Bilateral primary osteoarthritis of knee: Secondary | ICD-10-CM | POA: Diagnosis not present

## 2016-05-15 DIAGNOSIS — M25562 Pain in left knee: Secondary | ICD-10-CM | POA: Diagnosis not present

## 2016-05-29 DIAGNOSIS — Z6823 Body mass index (BMI) 23.0-23.9, adult: Secondary | ICD-10-CM | POA: Diagnosis not present

## 2016-05-29 DIAGNOSIS — A6920 Lyme disease, unspecified: Secondary | ICD-10-CM | POA: Diagnosis not present

## 2016-06-10 DIAGNOSIS — C44722 Squamous cell carcinoma of skin of right lower limb, including hip: Secondary | ICD-10-CM | POA: Diagnosis not present

## 2016-09-16 DIAGNOSIS — M545 Low back pain: Secondary | ICD-10-CM | POA: Diagnosis not present

## 2016-09-16 DIAGNOSIS — I10 Essential (primary) hypertension: Secondary | ICD-10-CM | POA: Diagnosis not present

## 2016-09-16 DIAGNOSIS — N183 Chronic kidney disease, stage 3 (moderate): Secondary | ICD-10-CM | POA: Diagnosis not present

## 2016-09-16 DIAGNOSIS — I129 Hypertensive chronic kidney disease with stage 1 through stage 4 chronic kidney disease, or unspecified chronic kidney disease: Secondary | ICD-10-CM | POA: Diagnosis not present

## 2016-09-16 DIAGNOSIS — Z6822 Body mass index (BMI) 22.0-22.9, adult: Secondary | ICD-10-CM | POA: Diagnosis not present

## 2016-09-16 DIAGNOSIS — R600 Localized edema: Secondary | ICD-10-CM | POA: Diagnosis not present

## 2016-09-16 DIAGNOSIS — E784 Other hyperlipidemia: Secondary | ICD-10-CM | POA: Diagnosis not present

## 2016-09-16 DIAGNOSIS — I491 Atrial premature depolarization: Secondary | ICD-10-CM | POA: Diagnosis not present

## 2016-09-16 DIAGNOSIS — E038 Other specified hypothyroidism: Secondary | ICD-10-CM | POA: Diagnosis not present

## 2016-09-16 DIAGNOSIS — Z23 Encounter for immunization: Secondary | ICD-10-CM | POA: Diagnosis not present

## 2016-09-16 DIAGNOSIS — M5380 Other specified dorsopathies, site unspecified: Secondary | ICD-10-CM | POA: Diagnosis not present

## 2016-11-14 DIAGNOSIS — M170 Bilateral primary osteoarthritis of knee: Secondary | ICD-10-CM | POA: Diagnosis not present

## 2016-11-14 DIAGNOSIS — M25562 Pain in left knee: Secondary | ICD-10-CM | POA: Diagnosis not present

## 2016-11-14 DIAGNOSIS — M25561 Pain in right knee: Secondary | ICD-10-CM | POA: Diagnosis not present

## 2016-11-20 DIAGNOSIS — M1711 Unilateral primary osteoarthritis, right knee: Secondary | ICD-10-CM | POA: Diagnosis not present

## 2016-11-20 DIAGNOSIS — M25561 Pain in right knee: Secondary | ICD-10-CM | POA: Diagnosis not present

## 2016-11-21 DIAGNOSIS — M25562 Pain in left knee: Secondary | ICD-10-CM | POA: Diagnosis not present

## 2016-11-21 DIAGNOSIS — M1712 Unilateral primary osteoarthritis, left knee: Secondary | ICD-10-CM | POA: Diagnosis not present

## 2016-12-03 DIAGNOSIS — M170 Bilateral primary osteoarthritis of knee: Secondary | ICD-10-CM | POA: Diagnosis not present

## 2016-12-03 DIAGNOSIS — M25562 Pain in left knee: Secondary | ICD-10-CM | POA: Diagnosis not present

## 2016-12-03 DIAGNOSIS — M25561 Pain in right knee: Secondary | ICD-10-CM | POA: Diagnosis not present

## 2016-12-09 DIAGNOSIS — E785 Hyperlipidemia, unspecified: Secondary | ICD-10-CM | POA: Diagnosis not present

## 2016-12-09 DIAGNOSIS — Z8679 Personal history of other diseases of the circulatory system: Secondary | ICD-10-CM | POA: Diagnosis not present

## 2016-12-09 DIAGNOSIS — I10 Essential (primary) hypertension: Secondary | ICD-10-CM | POA: Diagnosis not present

## 2017-03-12 DIAGNOSIS — Z125 Encounter for screening for malignant neoplasm of prostate: Secondary | ICD-10-CM | POA: Diagnosis not present

## 2017-03-12 DIAGNOSIS — I10 Essential (primary) hypertension: Secondary | ICD-10-CM | POA: Diagnosis not present

## 2017-03-12 DIAGNOSIS — E038 Other specified hypothyroidism: Secondary | ICD-10-CM | POA: Diagnosis not present

## 2017-03-12 DIAGNOSIS — E784 Other hyperlipidemia: Secondary | ICD-10-CM | POA: Diagnosis not present

## 2017-03-19 DIAGNOSIS — N183 Chronic kidney disease, stage 3 (moderate): Secondary | ICD-10-CM | POA: Diagnosis not present

## 2017-03-19 DIAGNOSIS — R600 Localized edema: Secondary | ICD-10-CM | POA: Diagnosis not present

## 2017-03-19 DIAGNOSIS — I491 Atrial premature depolarization: Secondary | ICD-10-CM | POA: Diagnosis not present

## 2017-03-19 DIAGNOSIS — I10 Essential (primary) hypertension: Secondary | ICD-10-CM | POA: Diagnosis not present

## 2017-03-19 DIAGNOSIS — I129 Hypertensive chronic kidney disease with stage 1 through stage 4 chronic kidney disease, or unspecified chronic kidney disease: Secondary | ICD-10-CM | POA: Diagnosis not present

## 2017-03-19 DIAGNOSIS — Z6821 Body mass index (BMI) 21.0-21.9, adult: Secondary | ICD-10-CM | POA: Diagnosis not present

## 2017-03-19 DIAGNOSIS — M545 Low back pain: Secondary | ICD-10-CM | POA: Diagnosis not present

## 2017-03-19 DIAGNOSIS — E784 Other hyperlipidemia: Secondary | ICD-10-CM | POA: Diagnosis not present

## 2017-03-19 DIAGNOSIS — Z1389 Encounter for screening for other disorder: Secondary | ICD-10-CM | POA: Diagnosis not present

## 2017-03-19 DIAGNOSIS — Z0000 Encounter for general adult medical examination without abnormal findings: Secondary | ICD-10-CM | POA: Diagnosis not present

## 2017-03-19 DIAGNOSIS — E038 Other specified hypothyroidism: Secondary | ICD-10-CM | POA: Diagnosis not present

## 2017-04-07 DIAGNOSIS — M170 Bilateral primary osteoarthritis of knee: Secondary | ICD-10-CM | POA: Diagnosis not present

## 2017-04-07 DIAGNOSIS — M25561 Pain in right knee: Secondary | ICD-10-CM | POA: Diagnosis not present

## 2017-04-07 DIAGNOSIS — M1711 Unilateral primary osteoarthritis, right knee: Secondary | ICD-10-CM | POA: Diagnosis not present

## 2017-04-07 DIAGNOSIS — Z789 Other specified health status: Secondary | ICD-10-CM | POA: Diagnosis not present

## 2017-04-07 DIAGNOSIS — M25562 Pain in left knee: Secondary | ICD-10-CM | POA: Diagnosis not present

## 2017-04-07 DIAGNOSIS — Z9229 Personal history of other drug therapy: Secondary | ICD-10-CM | POA: Diagnosis not present

## 2017-04-14 DIAGNOSIS — Z789 Other specified health status: Secondary | ICD-10-CM | POA: Diagnosis not present

## 2017-04-14 DIAGNOSIS — M25561 Pain in right knee: Secondary | ICD-10-CM | POA: Diagnosis not present

## 2017-04-14 DIAGNOSIS — M1711 Unilateral primary osteoarthritis, right knee: Secondary | ICD-10-CM | POA: Diagnosis not present

## 2017-04-21 DIAGNOSIS — Z9229 Personal history of other drug therapy: Secondary | ICD-10-CM | POA: Diagnosis not present

## 2017-04-21 DIAGNOSIS — Z789 Other specified health status: Secondary | ICD-10-CM | POA: Diagnosis not present

## 2017-04-21 DIAGNOSIS — M25561 Pain in right knee: Secondary | ICD-10-CM | POA: Diagnosis not present

## 2017-04-21 DIAGNOSIS — M1711 Unilateral primary osteoarthritis, right knee: Secondary | ICD-10-CM | POA: Diagnosis not present

## 2017-05-05 DIAGNOSIS — M25561 Pain in right knee: Secondary | ICD-10-CM | POA: Diagnosis not present

## 2017-05-05 DIAGNOSIS — Z789 Other specified health status: Secondary | ICD-10-CM | POA: Diagnosis not present

## 2017-05-05 DIAGNOSIS — Z9229 Personal history of other drug therapy: Secondary | ICD-10-CM | POA: Diagnosis not present

## 2017-05-05 DIAGNOSIS — M1711 Unilateral primary osteoarthritis, right knee: Secondary | ICD-10-CM | POA: Diagnosis not present

## 2017-07-31 DIAGNOSIS — Z8679 Personal history of other diseases of the circulatory system: Secondary | ICD-10-CM | POA: Diagnosis not present

## 2017-07-31 DIAGNOSIS — Z9889 Other specified postprocedural states: Secondary | ICD-10-CM | POA: Diagnosis not present

## 2017-07-31 DIAGNOSIS — E785 Hyperlipidemia, unspecified: Secondary | ICD-10-CM | POA: Diagnosis not present

## 2017-07-31 DIAGNOSIS — I10 Essential (primary) hypertension: Secondary | ICD-10-CM | POA: Diagnosis not present

## 2017-08-27 DIAGNOSIS — L508 Other urticaria: Secondary | ICD-10-CM | POA: Diagnosis not present

## 2017-08-27 DIAGNOSIS — D225 Melanocytic nevi of trunk: Secondary | ICD-10-CM | POA: Diagnosis not present

## 2017-08-27 DIAGNOSIS — Z1283 Encounter for screening for malignant neoplasm of skin: Secondary | ICD-10-CM | POA: Diagnosis not present

## 2017-08-27 DIAGNOSIS — T7840XA Allergy, unspecified, initial encounter: Secondary | ICD-10-CM | POA: Diagnosis not present

## 2017-09-03 DIAGNOSIS — C44311 Basal cell carcinoma of skin of nose: Secondary | ICD-10-CM | POA: Diagnosis not present

## 2017-09-22 DIAGNOSIS — Z6821 Body mass index (BMI) 21.0-21.9, adult: Secondary | ICD-10-CM | POA: Diagnosis not present

## 2017-09-22 DIAGNOSIS — M5380 Other specified dorsopathies, site unspecified: Secondary | ICD-10-CM | POA: Diagnosis not present

## 2017-09-22 DIAGNOSIS — E7849 Other hyperlipidemia: Secondary | ICD-10-CM | POA: Diagnosis not present

## 2017-09-22 DIAGNOSIS — N183 Chronic kidney disease, stage 3 (moderate): Secondary | ICD-10-CM | POA: Diagnosis not present

## 2017-09-22 DIAGNOSIS — M5416 Radiculopathy, lumbar region: Secondary | ICD-10-CM | POA: Diagnosis not present

## 2017-09-22 DIAGNOSIS — I129 Hypertensive chronic kidney disease with stage 1 through stage 4 chronic kidney disease, or unspecified chronic kidney disease: Secondary | ICD-10-CM | POA: Diagnosis not present

## 2017-09-22 DIAGNOSIS — E038 Other specified hypothyroidism: Secondary | ICD-10-CM | POA: Diagnosis not present

## 2017-09-22 DIAGNOSIS — R600 Localized edema: Secondary | ICD-10-CM | POA: Diagnosis not present

## 2017-09-22 DIAGNOSIS — I491 Atrial premature depolarization: Secondary | ICD-10-CM | POA: Diagnosis not present

## 2017-09-22 DIAGNOSIS — Z23 Encounter for immunization: Secondary | ICD-10-CM | POA: Diagnosis not present

## 2017-09-22 DIAGNOSIS — I10 Essential (primary) hypertension: Secondary | ICD-10-CM | POA: Diagnosis not present

## 2017-09-22 DIAGNOSIS — M545 Low back pain: Secondary | ICD-10-CM | POA: Diagnosis not present

## 2017-10-08 DIAGNOSIS — C44311 Basal cell carcinoma of skin of nose: Secondary | ICD-10-CM | POA: Diagnosis not present

## 2017-10-08 DIAGNOSIS — B880 Other acariasis: Secondary | ICD-10-CM | POA: Diagnosis not present

## 2017-12-30 DIAGNOSIS — M1711 Unilateral primary osteoarthritis, right knee: Secondary | ICD-10-CM | POA: Diagnosis not present

## 2017-12-30 DIAGNOSIS — M25562 Pain in left knee: Secondary | ICD-10-CM | POA: Diagnosis not present

## 2017-12-30 DIAGNOSIS — Z9229 Personal history of other drug therapy: Secondary | ICD-10-CM | POA: Diagnosis not present

## 2017-12-30 DIAGNOSIS — M25561 Pain in right knee: Secondary | ICD-10-CM | POA: Diagnosis not present

## 2017-12-30 DIAGNOSIS — M170 Bilateral primary osteoarthritis of knee: Secondary | ICD-10-CM | POA: Diagnosis not present

## 2018-01-06 DIAGNOSIS — M25561 Pain in right knee: Secondary | ICD-10-CM | POA: Diagnosis not present

## 2018-01-06 DIAGNOSIS — M1711 Unilateral primary osteoarthritis, right knee: Secondary | ICD-10-CM | POA: Diagnosis not present

## 2018-01-13 DIAGNOSIS — M1711 Unilateral primary osteoarthritis, right knee: Secondary | ICD-10-CM | POA: Diagnosis not present

## 2018-01-13 DIAGNOSIS — M25561 Pain in right knee: Secondary | ICD-10-CM | POA: Diagnosis not present

## 2018-01-20 DIAGNOSIS — M1711 Unilateral primary osteoarthritis, right knee: Secondary | ICD-10-CM | POA: Diagnosis not present

## 2018-01-20 DIAGNOSIS — M25561 Pain in right knee: Secondary | ICD-10-CM | POA: Diagnosis not present

## 2018-01-28 DIAGNOSIS — H04123 Dry eye syndrome of bilateral lacrimal glands: Secondary | ICD-10-CM | POA: Diagnosis not present

## 2018-03-18 DIAGNOSIS — E038 Other specified hypothyroidism: Secondary | ICD-10-CM | POA: Diagnosis not present

## 2018-03-18 DIAGNOSIS — I10 Essential (primary) hypertension: Secondary | ICD-10-CM | POA: Diagnosis not present

## 2018-03-18 DIAGNOSIS — Z125 Encounter for screening for malignant neoplasm of prostate: Secondary | ICD-10-CM | POA: Diagnosis not present

## 2018-03-18 DIAGNOSIS — R82998 Other abnormal findings in urine: Secondary | ICD-10-CM | POA: Diagnosis not present

## 2018-03-31 DIAGNOSIS — E038 Other specified hypothyroidism: Secondary | ICD-10-CM | POA: Diagnosis not present

## 2018-03-31 DIAGNOSIS — N183 Chronic kidney disease, stage 3 (moderate): Secondary | ICD-10-CM | POA: Diagnosis not present

## 2018-03-31 DIAGNOSIS — I129 Hypertensive chronic kidney disease with stage 1 through stage 4 chronic kidney disease, or unspecified chronic kidney disease: Secondary | ICD-10-CM | POA: Diagnosis not present

## 2018-03-31 DIAGNOSIS — M545 Low back pain: Secondary | ICD-10-CM | POA: Diagnosis not present

## 2018-03-31 DIAGNOSIS — I491 Atrial premature depolarization: Secondary | ICD-10-CM | POA: Diagnosis not present

## 2018-03-31 DIAGNOSIS — Z0000 Encounter for general adult medical examination without abnormal findings: Secondary | ICD-10-CM | POA: Diagnosis not present

## 2018-03-31 DIAGNOSIS — Z1389 Encounter for screening for other disorder: Secondary | ICD-10-CM | POA: Diagnosis not present

## 2018-03-31 DIAGNOSIS — R600 Localized edema: Secondary | ICD-10-CM | POA: Diagnosis not present

## 2018-03-31 DIAGNOSIS — M5416 Radiculopathy, lumbar region: Secondary | ICD-10-CM | POA: Diagnosis not present

## 2018-03-31 DIAGNOSIS — I10 Essential (primary) hypertension: Secondary | ICD-10-CM | POA: Diagnosis not present

## 2018-03-31 DIAGNOSIS — Z6823 Body mass index (BMI) 23.0-23.9, adult: Secondary | ICD-10-CM | POA: Diagnosis not present

## 2018-03-31 DIAGNOSIS — E7849 Other hyperlipidemia: Secondary | ICD-10-CM | POA: Diagnosis not present

## 2018-04-03 DIAGNOSIS — Z1212 Encounter for screening for malignant neoplasm of rectum: Secondary | ICD-10-CM | POA: Diagnosis not present

## 2018-07-08 DIAGNOSIS — M25561 Pain in right knee: Secondary | ICD-10-CM | POA: Diagnosis not present

## 2018-07-08 DIAGNOSIS — M1711 Unilateral primary osteoarthritis, right knee: Secondary | ICD-10-CM | POA: Diagnosis not present

## 2018-07-15 DIAGNOSIS — M1711 Unilateral primary osteoarthritis, right knee: Secondary | ICD-10-CM | POA: Diagnosis not present

## 2018-07-15 DIAGNOSIS — M25561 Pain in right knee: Secondary | ICD-10-CM | POA: Diagnosis not present

## 2018-07-29 DIAGNOSIS — I358 Other nonrheumatic aortic valve disorders: Secondary | ICD-10-CM | POA: Diagnosis not present

## 2018-07-29 DIAGNOSIS — I10 Essential (primary) hypertension: Secondary | ICD-10-CM | POA: Diagnosis not present

## 2018-07-29 DIAGNOSIS — Z9889 Other specified postprocedural states: Secondary | ICD-10-CM | POA: Diagnosis not present

## 2018-07-29 DIAGNOSIS — Z8679 Personal history of other diseases of the circulatory system: Secondary | ICD-10-CM | POA: Diagnosis not present

## 2018-08-28 DIAGNOSIS — Z86010 Personal history of colonic polyps: Secondary | ICD-10-CM | POA: Diagnosis not present

## 2018-08-28 DIAGNOSIS — D123 Benign neoplasm of transverse colon: Secondary | ICD-10-CM | POA: Diagnosis not present

## 2018-08-28 DIAGNOSIS — D122 Benign neoplasm of ascending colon: Secondary | ICD-10-CM | POA: Diagnosis not present

## 2018-09-01 DIAGNOSIS — D122 Benign neoplasm of ascending colon: Secondary | ICD-10-CM | POA: Diagnosis not present

## 2018-09-01 DIAGNOSIS — D123 Benign neoplasm of transverse colon: Secondary | ICD-10-CM | POA: Diagnosis not present

## 2018-09-03 DIAGNOSIS — Z23 Encounter for immunization: Secondary | ICD-10-CM | POA: Diagnosis not present

## 2018-09-28 DIAGNOSIS — I10 Essential (primary) hypertension: Secondary | ICD-10-CM | POA: Diagnosis not present

## 2018-09-28 DIAGNOSIS — E7849 Other hyperlipidemia: Secondary | ICD-10-CM | POA: Diagnosis not present

## 2018-09-28 DIAGNOSIS — E038 Other specified hypothyroidism: Secondary | ICD-10-CM | POA: Diagnosis not present

## 2018-09-28 DIAGNOSIS — N183 Chronic kidney disease, stage 3 (moderate): Secondary | ICD-10-CM | POA: Diagnosis not present

## 2018-09-28 DIAGNOSIS — I129 Hypertensive chronic kidney disease with stage 1 through stage 4 chronic kidney disease, or unspecified chronic kidney disease: Secondary | ICD-10-CM | POA: Diagnosis not present

## 2018-09-28 DIAGNOSIS — M5380 Other specified dorsopathies, site unspecified: Secondary | ICD-10-CM | POA: Diagnosis not present

## 2018-09-28 DIAGNOSIS — I491 Atrial premature depolarization: Secondary | ICD-10-CM | POA: Diagnosis not present

## 2018-09-28 DIAGNOSIS — M5416 Radiculopathy, lumbar region: Secondary | ICD-10-CM | POA: Diagnosis not present

## 2018-09-28 DIAGNOSIS — R600 Localized edema: Secondary | ICD-10-CM | POA: Diagnosis not present

## 2018-10-21 DIAGNOSIS — M1711 Unilateral primary osteoarthritis, right knee: Secondary | ICD-10-CM | POA: Diagnosis not present

## 2018-10-21 DIAGNOSIS — M25561 Pain in right knee: Secondary | ICD-10-CM | POA: Diagnosis not present

## 2018-10-28 DIAGNOSIS — M25561 Pain in right knee: Secondary | ICD-10-CM | POA: Diagnosis not present

## 2018-10-28 DIAGNOSIS — M1711 Unilateral primary osteoarthritis, right knee: Secondary | ICD-10-CM | POA: Diagnosis not present

## 2018-11-03 DIAGNOSIS — M25561 Pain in right knee: Secondary | ICD-10-CM | POA: Diagnosis not present

## 2018-11-03 DIAGNOSIS — M1711 Unilateral primary osteoarthritis, right knee: Secondary | ICD-10-CM | POA: Diagnosis not present

## 2018-11-04 DIAGNOSIS — R42 Dizziness and giddiness: Secondary | ICD-10-CM | POA: Diagnosis not present

## 2018-11-04 DIAGNOSIS — H903 Sensorineural hearing loss, bilateral: Secondary | ICD-10-CM | POA: Diagnosis not present

## 2018-11-06 DIAGNOSIS — R42 Dizziness and giddiness: Secondary | ICD-10-CM | POA: Diagnosis not present

## 2019-03-26 ENCOUNTER — Other Ambulatory Visit: Payer: Self-pay | Admitting: Cardiology

## 2019-03-26 MED ORDER — ROSUVASTATIN CALCIUM 5 MG PO TABS: 5.0000 mg | ORAL_TABLET | Freq: Every day | ORAL | 3 refills | Status: DC

## 2019-03-29 ENCOUNTER — Other Ambulatory Visit: Payer: Self-pay | Admitting: Cardiology

## 2019-03-29 MED ORDER — ROSUVASTATIN CALCIUM 10 MG PO TABS: 10.0000 mg | ORAL_TABLET | Freq: Every day | ORAL | 2 refills | Status: DC

## 2019-04-05 DIAGNOSIS — R82998 Other abnormal findings in urine: Secondary | ICD-10-CM | POA: Diagnosis not present

## 2019-04-05 DIAGNOSIS — E7849 Other hyperlipidemia: Secondary | ICD-10-CM | POA: Diagnosis not present

## 2019-04-05 DIAGNOSIS — Z125 Encounter for screening for malignant neoplasm of prostate: Secondary | ICD-10-CM | POA: Diagnosis not present

## 2019-04-05 DIAGNOSIS — E038 Other specified hypothyroidism: Secondary | ICD-10-CM | POA: Diagnosis not present

## 2019-04-05 DIAGNOSIS — I10 Essential (primary) hypertension: Secondary | ICD-10-CM | POA: Diagnosis not present

## 2019-04-12 DIAGNOSIS — Z0000 Encounter for general adult medical examination without abnormal findings: Secondary | ICD-10-CM | POA: Diagnosis not present

## 2019-04-12 DIAGNOSIS — R600 Localized edema: Secondary | ICD-10-CM | POA: Diagnosis not present

## 2019-04-12 DIAGNOSIS — Z1331 Encounter for screening for depression: Secondary | ICD-10-CM | POA: Diagnosis not present

## 2019-04-12 DIAGNOSIS — M5416 Radiculopathy, lumbar region: Secondary | ICD-10-CM | POA: Diagnosis not present

## 2019-04-12 DIAGNOSIS — M5380 Other specified dorsopathies, site unspecified: Secondary | ICD-10-CM | POA: Diagnosis not present

## 2019-04-12 DIAGNOSIS — N183 Chronic kidney disease, stage 3 (moderate): Secondary | ICD-10-CM | POA: Diagnosis not present

## 2019-04-12 DIAGNOSIS — Z1339 Encounter for screening examination for other mental health and behavioral disorders: Secondary | ICD-10-CM | POA: Diagnosis not present

## 2019-04-12 DIAGNOSIS — I129 Hypertensive chronic kidney disease with stage 1 through stage 4 chronic kidney disease, or unspecified chronic kidney disease: Secondary | ICD-10-CM | POA: Diagnosis not present

## 2019-04-12 DIAGNOSIS — I491 Atrial premature depolarization: Secondary | ICD-10-CM | POA: Diagnosis not present

## 2019-04-12 DIAGNOSIS — E785 Hyperlipidemia, unspecified: Secondary | ICD-10-CM | POA: Diagnosis not present

## 2019-04-12 DIAGNOSIS — I10 Essential (primary) hypertension: Secondary | ICD-10-CM | POA: Diagnosis not present

## 2019-04-12 DIAGNOSIS — E039 Hypothyroidism, unspecified: Secondary | ICD-10-CM | POA: Diagnosis not present

## 2019-05-04 DIAGNOSIS — S70362A Insect bite (nonvenomous), left thigh, initial encounter: Secondary | ICD-10-CM | POA: Diagnosis not present

## 2019-05-04 DIAGNOSIS — D045 Carcinoma in situ of skin of trunk: Secondary | ICD-10-CM | POA: Diagnosis not present

## 2019-05-04 DIAGNOSIS — C44722 Squamous cell carcinoma of skin of right lower limb, including hip: Secondary | ICD-10-CM | POA: Diagnosis not present

## 2019-05-04 DIAGNOSIS — C44729 Squamous cell carcinoma of skin of left lower limb, including hip: Secondary | ICD-10-CM | POA: Diagnosis not present

## 2019-05-07 ENCOUNTER — Other Ambulatory Visit: Payer: Self-pay

## 2019-05-07 MED ORDER — OLMESARTAN MEDOXOMIL 40 MG PO TABS: 40.0000 mg | ORAL_TABLET | Freq: Every day | ORAL | 0 refills | Status: DC

## 2019-05-10 ENCOUNTER — Other Ambulatory Visit: Payer: Self-pay | Admitting: Cardiology

## 2019-05-26 DIAGNOSIS — D0471 Carcinoma in situ of skin of right lower limb, including hip: Secondary | ICD-10-CM | POA: Diagnosis not present

## 2019-05-27 DIAGNOSIS — H2513 Age-related nuclear cataract, bilateral: Secondary | ICD-10-CM | POA: Diagnosis not present

## 2019-07-28 ENCOUNTER — Other Ambulatory Visit: Payer: Self-pay

## 2019-07-28 ENCOUNTER — Encounter: Payer: Self-pay | Admitting: Cardiology

## 2019-07-28 ENCOUNTER — Ambulatory Visit (INDEPENDENT_AMBULATORY_CARE_PROVIDER_SITE_OTHER): Payer: Medicare Other | Admitting: Cardiology

## 2019-07-28 VITALS — BP 140/82 | HR 74 | Temp 97.3°F | Ht 68.0 in | Wt 145.0 lb

## 2019-07-28 DIAGNOSIS — Z8679 Personal history of other diseases of the circulatory system: Secondary | ICD-10-CM

## 2019-07-28 DIAGNOSIS — Z9889 Other specified postprocedural states: Secondary | ICD-10-CM | POA: Diagnosis not present

## 2019-07-28 DIAGNOSIS — I10 Essential (primary) hypertension: Secondary | ICD-10-CM | POA: Diagnosis not present

## 2019-07-28 NOTE — Progress Notes (Signed)
Primary Physician/Referring:  Burnard Bunting, MD  Patient ID: Jeffery Garrett, male    DOB: 20-Apr-1941, 78 y.o.   MRN: [id number]  Chief Complaint  Patient presents with  . Atrial Fibrillation  . Hypertension  . Follow-up    21yr   HPI:    Jeffery Garrett  is a 78 y.o.  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 No recurrence since and has been on los dose Flecainide daily for many years. He continues to lead an active lifestyle, bikes 15-20 miles 2-3 times a week and also hikes mountains frequently.   He has had occasional palpitations lasting a few seconds that he is aware that there are PACs and PVCs. He also has benign essential tremors. Feels well. Here for annual follow-up visit for hypertension and atrial fibrillation. Non new symptoms.  Past Medical History:  Diagnosis Date  . A-fib ([place])   . Hyperlipidemia   . Hypertension    Past Surgical History:  Procedure Laterality Date  . ABLATION  2007  . ATRIAL FIBRILLATION ABLATION    . neck fusion  1995   Social History   Socioeconomic History  . Marital status: Married    Spouse name: Not on file  . Number of children: 2  . Years of education: Not on file  . Highest education level: Not on file  Occupational History  . Not on file  Social Needs  . Financial resource strain: Not on file  . Food insecurity    Worry: Not on file    Inability: Not on file  . Transportation needs    Medical: Not on file    Non-medical: Not on file  Tobacco Use  . Smoking status: Never Smoker  . Smokeless tobacco: Never Used  Substance and Sexual Activity  . Alcohol use: No  . Drug use: No  . Sexual activity: Not on file  Lifestyle  . Physical activity    Days per week: Not on file    Minutes per session: Not on file  . Stress: Not on file  Relationships  . Social  Herbalist on phone: Not on file    Gets together: Not on file    Attends religious service: Not on file    Active member of club or organization: Not on file    Attends meetings of clubs or organizations: Not on file    Relationship status: Not on file  . Intimate partner violence    Fear of current or ex partner: Not on file    Emotionally abused: Not on file    Physically abused: Not on file    Forced sexual activity: Not on file  Other Topics Concern  . Not on file  Social History Narrative   Patient resides with wife, consumes diet coke   ROS  Review of Systems  Constitution: Negative for chills, decreased appetite, malaise/fatigue and weight gain.  Cardiovascular: Negative for dyspnea on exertion, leg swelling and syncope.  Endocrine: Negative for cold intolerance.  Hematologic/Lymphatic: Does not bruise/bleed easily.  Musculoskeletal: Negative for joint swelling.  Gastrointestinal: Negative for abdominal pain, anorexia, change in bowel habit, hematochezia and melena.  Neurological: Positive for tremors. Negative for headaches and light-headedness.  Psychiatric/Behavioral: Negative for depression and substance abuse.  All other systems reviewed and are negative.  Objective  Blood pressure 140/82, pulse 74,  temperature (!) 97.3 F (36.3 C), height 5\' 8"  (1.727 m), weight 145 lb (65.8 kg), SpO2 98 %. Body mass index is 22.05 kg/m.   Physical Exam  Constitutional: He appears well-developed and well-nourished. No distress.  HENT:  Head: Atraumatic.  Eyes: Conjunctivae are normal.  Neck: Neck supple. No JVD present. No thyromegaly present.  Cardiovascular: Regular rhythm, S1 normal, S2 normal and intact distal pulses. Bradycardia present. Exam reveals no gallop.  Murmur heard.  Midsystolic murmur is present with a grade of 2/6 at the upper right sternal border. Pulmonary/Chest: Effort normal and breath sounds normal.  Abdominal: Soft. Bowel sounds are normal.   Musculoskeletal: Normal range of motion.        General: No edema.  Neurological: He is alert.  Skin: Skin is warm and dry.  Psychiatric: He has a normal mood and affect.   Radiology: No results found.  Laboratory examination:   No results for input(s): NA, K, CL, CO2, GLUCOSE, BUN, CREATININE, CALCIUM, GFRNONAA, GFRAA in the last 8760 hours. No flowsheet data found. No flowsheet data found. Lipid Panel  No results found for: CHOL, TRIG, HDL, CHOLHDL, VLDL, LDLCALC, LDLDIRECT HEMOGLOBIN A1C No results found for: HGBA1C, MPG TSH No results for input(s): TSH in the last 8760 hours. Medications   Prior to Admission medications   Medication Sig Start Date End Date Taking? Authorizing Provider  amLODipine (NORVASC) 5 MG tablet  08/23/14   [provider]  aspirin 81 MG tablet Take 81 mg by mouth daily.    [provider]  Cholecalciferol (D3-1000) 25 MCG (1000 UT) capsule Take 1,000 Units by mouth daily.    [provider]  Coenzyme Q10 (COQ10) 100 MG CAPS Take by mouth.    [provider]  flecainide (TAMBOCOR) 50 MG tablet TAKE 1 TABLET BY MOUTH DAILY 05/10/19   Adrian Prows, MD  ibuprofen (ADVIL) 800 MG tablet Take 800 mg by mouth every 8 (eight) hours as needed.    [provider]  Magnesium Oxide 500 MG CAPS Take by mouth.    [provider]  nebivolol (BYSTOLIC) 5 MG tablet Take 5 mg by mouth daily.    [provider]  Nutritional Supplements (PROSTATE ENZYME FORMULA PO) Take by mouth.    [provider]  olmesartan (BENICAR) 40 MG tablet Take 1 tablet (40 mg total) by mouth daily. 05/07/19   Adrian Prows, MD  Omega-3 Fatty Acids (FISH OIL) 1000 MG CAPS Take by mouth.    [provider]  permethrin (ELIMITE) 5 % cream  07/28/14   [provider]  primidone (MYSOLINE) 50 MG tablet 1/2 pill each bedtime x 1 week, then 1 pill nightly x 1 week, then 1 1/2 pills nightly x 1 week, then 2 pills nightly  thereafter. 08/30/14   Star Age, MD  rosuvastatin (CRESTOR) 10 MG tablet Take 1 tablet (10 mg total) by mouth daily. 03/29/19   Miquel Dunn, NP  SYNTHROID 100 MCG tablet  08/15/14   [provider]  vitamin C (ASCORBIC ACID) 500 MG tablet Take 500 mg by mouth daily.    [provider]     Current Outpatient Medications  Medication Instructions  . aspirin 81 mg, Daily  . Cholecalciferol (D3-1000) 1,000 Units, Oral, Daily  . Coenzyme Q10 (COQ10 PO) 300 mg, Oral, Daily  . flecainide (TAMBOCOR) 50 MG tablet TAKE 1 TABLET BY MOUTH DAILY  . ibuprofen (ADVIL) 800 mg, Oral, Every 8 hours PRN  . Magnesium Oxide  500 MG CAPS Oral, occasionally  . Nutritional Supplements (PROSTATE ENZYME FORMULA PO) Oral  . olmesartan (BENICAR) 40 mg, Oral, Daily  . Omega-3 Fatty Acids (FISH OIL) 1000 MG CAPS Oral  . rosuvastatin (CRESTOR) 10 mg, Oral, Daily  . Synthroid 100 mcg, Oral, Daily before breakfast  . vitamin C (ASCORBIC ACID) 500 mg, Oral, Daily    Cardiac Studies:   Echocardiogram 11/14/2015:   Left ventricle cavity is normal in size. Normal global wall motion. Normal diastolic filling pattern. Calculated EF 64%. Left atrial cavity is mildly dilated. Trace mitral regurgitation. Trace tricuspid regurgitation. No evidence of pulmonary hypertension. Essentially normal echocardiogram.   Assessment     ICD-10-CM   1. Essential hypertension  I10   2. S/P ablation of atrial fibrillation  Z98.890    Z86.79    2008 at [facility]  3. Atrial fibrillation, unspecified type ([place])  I48.91 EKG 12-Lead    EKG 07/31/2017: Normal sinus rhythm at 58 bpm, left atrial abnormality, no evidence of ischemia. Normal EKG. No significant change from EKG 12/09/2016.  Recommendations:   Patient with hypertension, remote atrial fibrillation ablation in 2008 and has been on low-dose of flecainide since then, presents here for follow-up.  He is concerned about discontinuing any of the medications, as  he remains completely asymptomatic, continues to exercise avidly, no change in physical exam on the EKG, I did not make any changes to his medications.  Blood pressure is well controlled, usually in 110-120 range, although was slightly elevated today I did not make changes to his medications as well.  I do not have his recent labs, we will obtain from his PCP Dr.  Reynaldo Minium.  Adrian Prows, MD, [facility] 07/28/2019, 2:38 PM [facility] [place] Pager: [phone] Office: [phone] If no answer Cell [phone]

## 2019-08-04 ENCOUNTER — Other Ambulatory Visit: Payer: Self-pay | Admitting: Cardiology

## 2019-08-09 ENCOUNTER — Other Ambulatory Visit: Payer: Self-pay | Admitting: Cardiology

## 2019-08-09 DIAGNOSIS — E871 Hypo-osmolality and hyponatremia: Secondary | ICD-10-CM

## 2019-08-25 DIAGNOSIS — Z23 Encounter for immunization: Secondary | ICD-10-CM | POA: Diagnosis not present

## 2019-09-04 ENCOUNTER — Emergency Department (HOSPITAL_BASED_OUTPATIENT_CLINIC_OR_DEPARTMENT_OTHER)
Admission: EM | Admit: 2019-09-04 | Discharge: 2019-09-04 | Disposition: A | Discharge status: Home / Self Care | Payer: Medicare Other | Attending: Emergency Medicine | Admitting: Emergency Medicine

## 2019-09-04 ENCOUNTER — Emergency Department (HOSPITAL_BASED_OUTPATIENT_CLINIC_OR_DEPARTMENT_OTHER): Payer: Medicare Other

## 2019-09-04 ENCOUNTER — Other Ambulatory Visit: Payer: Self-pay

## 2019-09-04 ENCOUNTER — Encounter (HOSPITAL_BASED_OUTPATIENT_CLINIC_OR_DEPARTMENT_OTHER): Payer: Self-pay

## 2019-09-04 DIAGNOSIS — E871 Hypo-osmolality and hyponatremia: Secondary | ICD-10-CM

## 2019-09-04 DIAGNOSIS — R4020 Unspecified coma: Secondary | ICD-10-CM | POA: Diagnosis not present

## 2019-09-04 DIAGNOSIS — Z7982 Long term (current) use of aspirin: Secondary | ICD-10-CM | POA: Diagnosis not present

## 2019-09-04 DIAGNOSIS — I10 Essential (primary) hypertension: Secondary | ICD-10-CM

## 2019-09-04 DIAGNOSIS — E860 Dehydration: Secondary | ICD-10-CM | POA: Diagnosis not present

## 2019-09-04 DIAGNOSIS — R4182 Altered mental status, unspecified: Secondary | ICD-10-CM | POA: Diagnosis not present

## 2019-09-04 DIAGNOSIS — Z79899 Other long term (current) drug therapy: Secondary | ICD-10-CM | POA: Insufficient documentation

## 2019-09-04 LAB — CBC
HCT: 35.5 % — ABNORMAL LOW (ref 39.0–52.0)
Hemoglobin: 12.3 g/dL — ABNORMAL LOW (ref 13.0–17.0)
MCH: 32.3 pg (ref 26.0–34.0)
MCHC: 34.6 g/dL (ref 30.0–36.0)
MCV: 93.2 fL (ref 80.0–100.0)
Platelets: 177 10*3/uL (ref 150–400)
RBC: 3.81 MIL/uL — ABNORMAL LOW (ref 4.22–5.81)
RDW: 12.4 % (ref 11.5–15.5)
WBC: 8.1 10*3/uL (ref 4.0–10.5)
nRBC: 0 % (ref 0.0–0.2)

## 2019-09-04 LAB — PROTIME-INR
INR: 1 (ref 0.8–1.2)
Prothrombin Time: 13.5 seconds (ref 11.4–15.2)

## 2019-09-04 LAB — RAPID URINE DRUG SCREEN, HOSP PERFORMED
Amphetamines: NOT DETECTED
Barbiturates: NOT DETECTED
Benzodiazepines: NOT DETECTED
Cocaine: NOT DETECTED
Opiates: NOT DETECTED
Tetrahydrocannabinol: NOT DETECTED

## 2019-09-04 LAB — URINALYSIS, ROUTINE W REFLEX MICROSCOPIC
Bilirubin Urine: NEGATIVE
Glucose, UA: NEGATIVE mg/dL
Hgb urine dipstick: NEGATIVE
Ketones, ur: NEGATIVE mg/dL
Leukocytes,Ua: NEGATIVE
Nitrite: NEGATIVE
Protein, ur: NEGATIVE mg/dL
Specific Gravity, Urine: <1.005 — ABNORMAL LOW (ref 1.005–1.030)
pH: 7 (ref 5.0–8.0)

## 2019-09-04 LAB — COMPREHENSIVE METABOLIC PANEL
ALT: 23 U/L (ref 0–44)
AST: 39 U/L (ref 15–41)
Albumin: 4.6 g/dL (ref 3.5–5.0)
Alkaline Phosphatase: 31 U/L — ABNORMAL LOW (ref 38–126)
Anion gap: 11 (ref 5–15)
BUN: 16 mg/dL (ref 8–23)
CO2: 23 mmol/L (ref 22–32)
Calcium: 8.8 mg/dL — ABNORMAL LOW (ref 8.9–10.3)
Chloride: 90 mmol/L — ABNORMAL LOW (ref 98–111)
Creatinine, Ser: 1.11 mg/dL (ref 0.61–1.24)
GFR calc Af Amer: >60 mL/min (ref >60)
GFR calc non Af Amer: >60 mL/min (ref >60)
Glucose, Bld: 113 mg/dL — ABNORMAL HIGH (ref 70–99)
Potassium: 4 mmol/L (ref 3.5–5.1)
Sodium: 124 mmol/L — ABNORMAL LOW (ref 135–145)
Total Bilirubin: 0.9 mg/dL (ref 0.3–1.2)
Total Protein: 7.6 g/dL (ref 6.5–8.1)

## 2019-09-04 LAB — ETHANOL: Alcohol, Ethyl (B): <10 mg/dL (ref <10)

## 2019-09-04 LAB — DIFFERENTIAL
Abs Immature Granulocytes: 0.02 10*3/uL (ref 0.00–0.07)
Basophils Absolute: 0 10*3/uL (ref 0.0–0.1)
Basophils Relative: 0 %
Eosinophils Absolute: 0.1 10*3/uL (ref 0.0–0.5)
Eosinophils Relative: 1 %
Immature Granulocytes: 0 %
Lymphocytes Relative: 13 %
Lymphs Abs: 1.1 10*3/uL (ref 0.7–4.0)
Monocytes Absolute: 0.7 10*3/uL (ref 0.1–1.0)
Monocytes Relative: 9 %
Neutro Abs: 6.2 10*3/uL (ref 1.7–7.7)
Neutrophils Relative %: 77 %

## 2019-09-04 LAB — APTT: aPTT: 27 seconds (ref 24–36)

## 2019-09-04 MED ORDER — SODIUM CHLORIDE 0.9 % IV BOLUS
1000.0000 mL | Freq: Once | INTRAVENOUS | Status: AC
  Administered 2019-09-04: 1000 mL via INTRAVENOUS

## 2019-09-04 NOTE — Discharge Instructions (Signed)
Your sodi

## 2019-09-04 NOTE — ED Notes (Signed)
Pt ambulatory to [facility]

## 2019-09-04 NOTE — ED Notes (Signed)
ED Provider at bedside. 

## 2019-09-04 NOTE — ED Provider Notes (Signed)
[facility] EMERGENCY DEPARTMENT Provider Note   CSN: [id number] Arrival date & time: 09/04/19  1938     History   Chief Complaint Chief Complaint  Patient presents with  . Hypertension    HPI Jeffery Garrett is a 78 y.o. male.     Pt presents to the ED today with some confusion and HTN.  The pt was mountain biking today in [place].  He went from 10-3:30.  He did not bring any water or snacks with him while biking.  He did drink and eat when he was done.  When he came home, his wife asked him where he ate with his buddies and he could not remember.  He denies any falls or head trauma.  He denies any other neurologic sx.  He feels "addled" and his bp is elevated.  He took an extra benicar before coming in today.       Past Medical History:  Diagnosis Date  . A-fib ([place])   . Hyperlipidemia   . Hypertension     There are no active problems to display for this patient.   Past Surgical History:  Procedure Laterality Date  . ABLATION  2007  . ATRIAL FIBRILLATION ABLATION    . neck fusion  1995        Home Medications    Prior to Admission medications   Medication Sig Start Date End Date Taking? Authorizing Provider  aspirin 81 MG tablet Take 81 mg by mouth daily.    [provider]  Cholecalciferol (D3-1000) 25 MCG (1000 UT) capsule Take 1,000 Units by mouth daily.    [provider]  Coenzyme Q10 (COQ10 PO) Take 300 mg by mouth daily.     [provider]  flecainide (TAMBOCOR) 50 MG tablet TAKE 1 TABLET BY MOUTH DAILY 05/10/19   Adrian Prows, MD  ibuprofen (ADVIL) 800 MG tablet Take 800 mg by mouth every 8 (eight) hours as needed.    [provider]  Magnesium Oxide 500 MG CAPS Take by mouth. occasionally    [provider]  Nutritional Supplements (PROSTATE ENZYME FORMULA PO) Take by mouth.    [provider]  olmesartan (BENICAR) 40 MG tablet TAKE 1 TABLET(40 MG) BY MOUTH DAILY 08/04/19   Adrian Prows, MD   Omega-3 Fatty Acids (FISH OIL) 1000 MG CAPS Take by mouth.    [provider]  rosuvastatin (CRESTOR) 10 MG tablet Take 1 tablet (10 mg total) by mouth daily. 03/29/19   Miquel Dunn, NP  SYNTHROID 100 MCG tablet Take 100 mcg by mouth daily before breakfast.  08/15/14   [provider]  vitamin C (ASCORBIC ACID) 500 MG tablet Take 500 mg by mouth daily.    [provider]    Family History Family History  Adopted: Yes    Social History Social History   Tobacco Use  . Smoking status: Never Smoker  . Smokeless tobacco: Never Used  Substance Use Topics  . Alcohol use: No  . Drug use: No     Allergies   Patient has no known allergies.   Review of Systems Review of Systems  Neurological:       Confusion  All other systems reviewed and are negative.    Physical Exam Updated Vital Signs BP (!) 181/89 (BP Location: Right Arm)   Pulse 73   Temp 98.1 F (36.7 C) (Oral)   Resp 18   Wt 70 kg   SpO2 100%  BMI 23.46 kg/m   Physical Exam Vitals signs and nursing note reviewed.  Constitutional:      Appearance: Normal appearance.  HENT:     Head: Normocephalic and atraumatic.     Right Ear: External ear normal.     Left Ear: External ear normal.     Nose: Nose normal.     Mouth/Throat:     Mouth: Mucous membranes are moist.     Pharynx: Oropharynx is clear.  Eyes:     Extraocular Movements: Extraocular movements intact.     Conjunctiva/sclera: Conjunctivae normal.     Pupils: Pupils are equal, round, and reactive to light.  Neck:     Musculoskeletal: Normal range of motion and neck supple.  Cardiovascular:     Rate and Rhythm: Normal rate and regular rhythm.     Pulses: Normal pulses.     Heart sounds: Normal heart sounds.  Pulmonary:     Effort: Pulmonary effort is normal.     Breath sounds: Normal breath sounds.  Abdominal:     General: Abdomen is flat. Bowel sounds are normal.     Palpations: Abdomen is soft.   Musculoskeletal: Normal range of motion.  Skin:    General: Skin is warm.     Capillary Refill: Capillary refill takes less than 2 seconds.  Neurological:     General: No focal deficit present.     Mental Status: He is alert and oriented to person, place, and time.  Psychiatric:        Mood and Affect: Mood normal.        Behavior: Behavior normal.        Thought Content: Thought content normal.        Judgment: Judgment normal.      ED Treatments / Results  Labs (all labs ordered are listed, but only abnormal results are displayed) Labs Reviewed  CBC - Abnormal; Notable for the following components:      Result Value   RBC 3.81 (*)    Hemoglobin 12.3 (*)    HCT 35.5 (*)    All other components within normal limits  COMPREHENSIVE METABOLIC PANEL - Abnormal; Notable for the following components:   Sodium 124 (*)    Chloride 90 (*)    Glucose, Bld 113 (*)    Calcium 8.8 (*)    Alkaline Phosphatase 31 (*)    All other components within normal limits  URINALYSIS, ROUTINE W REFLEX MICROSCOPIC - Abnormal; Notable for the following components:   Specific Gravity, Urine <1.005 (*)    All other components within normal limits  ETHANOL  PROTIME-INR  APTT  DIFFERENTIAL  RAPID URINE DRUG SCREEN, HOSP PERFORMED    EKG EKG Interpretation  Date/Time:  Saturday September 04 2019 20:07:23 EDT Ventricular Rate:  69 PR Interval:    QRS Duration: 99 QT Interval:  403 QTC Calculation: 432 R Axis:   62 Text Interpretation:  Sinus rhythm Prolonged PR interval No old tracing to compare Confirmed by Isla Pence [phone]) on 09/04/2019 8:20:52 PM   Radiology Ct Head Wo Contrast  Result Date: 09/04/2019 CLINICAL DATA:  Altered LOC EXAM: CT HEAD WITHOUT CONTRAST TECHNIQUE: Contiguous axial images were obtained from the base of the skull through the vertex without intravenous contrast. COMPARISON:  None. FINDINGS: Brain: No acute territorial infarction, hemorrhage or intracranial mass.  Mild atrophy. Patchy hypodensity within the periventricular and deep white matter presumably due to small vessel ischemic change. Nonenlarged ventricles Vascular: No hyperdense vessels.  No  unexpected calcification Skull: Normal. Negative for fracture or focal lesion. Sinuses/Orbits: No acute finding. Other: None IMPRESSION: 1. No CT evidence for acute intracranial abnormality. 2. Mild atrophy and small vessel ischemic changes of the white matter Electronically Signed   By: Donavan Foil M.D.   On: 09/04/2019 20:23    Procedures Procedures (including critical care time)  Medications Ordered in ED Medications  sodium chloride 0.9 % bolus 1,000 mL (1,000 mLs Intravenous New Bag/Given 09/04/19 2009)     Initial Impression / Assessment and Plan / ED Course  I have reviewed the triage vital signs and the nursing notes.  Pertinent labs & imaging results that were available during my care of the patient were reviewed by me and considered in my medical decision making (see chart for details).       Pt is feeling better after IVFs.  BP has improved, but it is still slightly high.  No evidence of stroke on CT.  Pt's sodium is low.  Pt's wife said Dr. Einar Gip has said it's been low in the past, but they did not know a number.  Pt is encouraged to continue to live his active lifestyle, but he is also encouraged to drink fluids while exercising.  He knows to return if worse.    Final Clinical Impressions(s) / ED Diagnoses   Final diagnoses:  Essential hypertension  Hyponatremia  Dehydration    ED Discharge Orders    None       Isla Pence, MD 09/04/19 2108

## 2019-09-04 NOTE — ED Notes (Signed)
Patient transported to CT 

## 2019-09-04 NOTE — ED Triage Notes (Signed)
Pt reports mountain biking all day. Came home, took a shower. Sts he feels like his BP is up. Took his BP and it was elevated.

## 2019-09-04 NOTE — ED Notes (Addendum)
Pt went mountain biking today and feels that he got dehydrated. Pt also had an episode where he was unable to recall where he had lunch. Pt also concerned for high BP.

## 2019-09-10 ENCOUNTER — Ambulatory Visit (INDEPENDENT_AMBULATORY_CARE_PROVIDER_SITE_OTHER): Payer: Medicare Other | Admitting: Cardiology

## 2019-09-10 ENCOUNTER — Other Ambulatory Visit: Payer: Self-pay | Admitting: Cardiology

## 2019-09-10 ENCOUNTER — Encounter: Payer: Self-pay | Admitting: Cardiology

## 2019-09-10 ENCOUNTER — Other Ambulatory Visit: Payer: Self-pay

## 2019-09-10 VITALS — HR 78 | Temp 97.8°F | Ht 68.0 in | Wt 143.5 lb

## 2019-09-10 DIAGNOSIS — I10 Essential (primary) hypertension: Secondary | ICD-10-CM

## 2019-09-10 DIAGNOSIS — E871 Hypo-osmolality and hyponatremia: Secondary | ICD-10-CM | POA: Diagnosis not present

## 2019-09-10 DIAGNOSIS — Z8679 Personal history of other diseases of the circulatory system: Secondary | ICD-10-CM

## 2019-09-10 DIAGNOSIS — C44729 Squamous cell carcinoma of skin of left lower limb, including hip: Secondary | ICD-10-CM | POA: Diagnosis not present

## 2019-09-10 DIAGNOSIS — I440 Atrioventricular block, first degree: Secondary | ICD-10-CM | POA: Diagnosis not present

## 2019-09-10 DIAGNOSIS — Z9889 Other specified postprocedural states: Secondary | ICD-10-CM | POA: Diagnosis not present

## 2019-09-10 DIAGNOSIS — C44722 Squamous cell carcinoma of skin of right lower limb, including hip: Secondary | ICD-10-CM | POA: Diagnosis not present

## 2019-09-10 MED ORDER — HYDRALAZINE HCL 25 MG PO TABS: 25.0000 mg | ORAL_TABLET | Freq: Three times a day (TID) | ORAL | 2 refills | Status: DC

## 2019-09-10 MED ORDER — AMLODIPINE BESYLATE 5 MG PO TABS: 5.0000 mg | ORAL_TABLET | Freq: Every day | ORAL | 2 refills | Status: DC

## 2019-09-10 NOTE — Progress Notes (Signed)
Primary Physician/Referring:  Burnard Bunting, MD  Patient ID: Jeffery Garrett, male    DOB: 1941/06/18, 78 y.o.   MRN: [id number]  Chief Complaint  Patient presents with  . Hypertension  . Atrial Fibrillation  . Follow-up    Sodium levels   HPI:    Jeffery Garrett  is a 78 y.o.  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 No recurrence since and has been on los dose Flecainide daily for many years. He continues to lead an active lifestyle, bikes 15-20 miles 2-3 times a week and also hikes mountains frequently.   He has had occasional palpitations lasting a few seconds that he is aware that there are PACs and PVCs. He also has benign essential tremors.   He presented to the emergency department with elevated blood pressure, not feeling well and was found to have worsening hyponatremia, blood pressure was uncontrolled, discharged home with recommendation to follow-up with [place] for management of hypertension and hyponatremia.  No altered mental status, otherwise feels well, states that his been on olmesartan for many years and is wondering why he should change the medication.  Past Medical History:  Diagnosis Date  . A-fib ([place])   . Hyperlipidemia   . Hypertension    Past Surgical History:  Procedure Laterality Date  . ABLATION  2007  . ATRIAL FIBRILLATION ABLATION    . neck fusion  1995   Social History   Socioeconomic History  . Marital status: Married    Spouse name: Not on file  . Number of children: 2  . Years of education: Not on file  . Highest education level: Not on file  Occupational History  . Not on file  Social Needs  . Financial resource strain: Not on file  . Food insecurity    Worry: Not on file    Inability: Not on file  . Transportation needs    Medical: Not on file    Non-medical: Not on file   Tobacco Use  . Smoking status: Never Smoker  . Smokeless tobacco: Never Used  Substance and Sexual Activity  . Alcohol use: No  . Drug use: No  . Sexual activity: Not on file  Lifestyle  . Physical activity    Days per week: Not on file    Minutes per session: Not on file  . Stress: Not on file  Relationships  . Social Herbalist on phone: Not on file    Gets together: Not on file    Attends religious service: Not on file    Active member of club or organization: Not on file    Attends meetings of clubs or organizations: Not on file    Relationship status: Not on file  . Intimate partner violence    Fear of current or ex partner: Not on file    Emotionally abused: Not on file    Physically abused: Not on file    Forced sexual activity: Not on file  Other Topics Concern  . Not on file  Social History Narrative   Patient resides with wife, consumes diet coke   ROS  Review of Systems  Constitution: Negative for chills, decreased appetite, malaise/fatigue and weight gain.  Cardiovascular: Negative for dyspnea on exertion, leg swelling and syncope.  Endocrine: Negative for cold intolerance.  Hematologic/Lymphatic: Does not bruise/bleed easily.  Musculoskeletal:  Negative for joint swelling.  Gastrointestinal: Negative for abdominal pain, anorexia, change in bowel habit, hematochezia and melena.  Neurological: Positive for tremors. Negative for headaches and light-headedness.  Psychiatric/Behavioral: Negative for depression and substance abuse.  All other systems reviewed and are negative.  Objective  Pulse 78, temperature 97.8 F (36.6 C), height 5\' 8"  (1.727 m), weight 143 lb 8 oz (65.1 kg), SpO2 94 %. Body mass index is 21.82 kg/m. Patient refused BP check.   Vitals with BMI 09/10/2019 09/04/2019 09/04/2019  Height 5\' 8"  - -  Weight 143 lbs 8 oz - -  BMI [id number] - -  Systolic - [id number]  Diastolic - 90 87  Pulse 78 64 66    Physical Exam  Constitutional: He  appears well-developed and well-nourished. No distress.  HENT:  Head: Atraumatic.  Eyes: Conjunctivae are normal.  Neck: Neck supple. No JVD present. No thyromegaly present.  Cardiovascular: Regular rhythm, S1 normal, S2 normal and intact distal pulses. Bradycardia present. Exam reveals no gallop.  Murmur heard.  Midsystolic murmur is present with a grade of 2/6 at the upper right sternal border. Pulmonary/Chest: Effort normal and breath sounds normal.  Abdominal: Soft. Bowel sounds are normal.  Musculoskeletal: Normal range of motion.        General: No edema.  Neurological: He is alert.  Skin: Skin is warm and dry.  Psychiatric: He has a normal mood and affect.   Radiology: No results found.  Laboratory examination:    Ref Range & Units 09/10/19  Osmolality Meas 280 - 301 mOsmol/kg 271Low      Ref Range & Units 09/10/19 9d ago  Sodium 134 - 144 mmol/L 133Low   124Low     Ref Range & Units 09/10/19  Osmolality, Ur mOsmol/kg 411 Random:  50 - 1400     CMP Latest Ref Rng & Units 09/10/2019 09/04/2019  Glucose 70 - 99 mg/dL - 113(H)  BUN 8 - 23 mg/dL - 16  Creatinine 0.61 - 1.24 mg/dL - 1.11  Sodium 134 - 144 mmol/L 133(L) 124(L)  Potassium 3.5 - 5.1 mmol/L - 4.0  Chloride 98 - 111 mmol/L - 90(L)  CO2 22 - 32 mmol/L - 23  Calcium 8.9 - 10.3 mg/dL - 8.8(L)  Total Protein 6.5 - 8.1 g/dL - 7.6  Total Bilirubin 0.3 - 1.2 mg/dL - 0.9  Alkaline Phos 38 - 126 U/L - 31(L)  AST 15 - 41 U/L - 39  ALT 0 - 44 U/L - 23   CBC Latest Ref Rng & Units 09/04/2019  WBC 4.0 - 10.5 K/uL 8.1  Hemoglobin 13.0 - 17.0 g/dL 12.3(L)  Hematocrit 39.0 - 52.0 % 35.5(L)  Platelets 150 - 400 K/uL 177   Lipid Panel  No results found for: CHOL, TRIG, HDL, CHOLHDL, VLDL, LDLCALC, LDLDIRECT HEMOGLOBIN A1C No results found for: HGBA1C, MPG TSH No results for input(s): TSH in the last 8760 hours.  Labs 04/05/2019: Serum glucose 97 mg, BUN 20, creatinine 1.1, sodium 129, potassium 4.5, CMP otherwise  normal, CBC normal.  Total cholesterol 153, triglycerides 90, HDL 50, LDL 82, nondistended cholesterol 100.  TSH, free T4 and PSA normal.  Lipid Panel  No results found for: CHOL, TRIG, HDL, CHOLHDL, VLDL, LDLCALC, LDLDIRECT HEMOGLOBIN A1C No results found for: HGBA1C, MPG TSH No results for input(s): TSH in the last 8760 hours. Medications   Prior to Admission medications   Medication Sig Start Date End Date Taking? Authorizing Provider  amLODipine (NORVASC) 5 MG tablet  08/23/14   [provider]  aspirin 81 MG tablet Take 81 mg by mouth daily.    [provider]  Cholecalciferol (D3-1000) 25 MCG (1000 UT) capsule Take 1,000 Units by mouth daily.    [provider]  Coenzyme Q10 (COQ10) 100 MG CAPS Take by mouth.    [provider]  flecainide (TAMBOCOR) 50 MG tablet TAKE 1 TABLET BY MOUTH DAILY 05/10/19   Adrian Prows, MD  ibuprofen (ADVIL) 800 MG tablet Take 800 mg by mouth every 8 (eight) hours as needed.    [provider]  Magnesium Oxide 500 MG CAPS Take by mouth.    [provider]  nebivolol (BYSTOLIC) 5 MG tablet Take 5 mg by mouth daily.    [provider]  Nutritional Supplements (PROSTATE ENZYME FORMULA PO) Take by mouth.    [provider]  olmesartan (BENICAR) 40 MG tablet Take 1 tablet (40 mg total) by mouth daily. 05/07/19   Adrian Prows, MD  Omega-3 Fatty Acids (FISH OIL) 1000 MG CAPS Take by mouth.    [provider]  permethrin (ELIMITE) 5 % cream  07/28/14   [provider]  primidone (MYSOLINE) 50 MG tablet 1/2 pill each bedtime x 1 week, then 1 pill nightly x 1 week, then 1 1/2 pills nightly x 1 week, then 2 pills nightly thereafter. 08/30/14   Star Age, MD  rosuvastatin (CRESTOR) 10 MG tablet Take 1 tablet (10 mg total) by mouth daily. 03/29/19   Miquel Dunn, NP  SYNTHROID 100 MCG tablet  08/15/14   [provider]  vitamin C (ASCORBIC ACID) 500 MG tablet Take 500 mg  by mouth daily.    [provider]     Current Outpatient Medications  Medication Instructions  . amLODipine (NORVASC) 5 mg, Oral, Daily  . aspirin 81 mg, Daily  . Cholecalciferol (D3-1000) 1,000 Units, Oral, Daily  . Coenzyme Q10 (COQ10 PO) 300 mg, Oral, Daily  . flecainide (TAMBOCOR) 50 MG tablet TAKE 1 TABLET BY MOUTH DAILY  . hydrALAZINE (APRESOLINE) 25 mg, Oral, 3 times daily, For SBP >140 mm Hg Can take 2 additional tablets  . ibuprofen (ADVIL) 800 mg, Oral, Every 8 hours PRN  . Magnesium Oxide 500 MG CAPS Oral, occasionally  . Nutritional Supplements (PROSTATE ENZYME FORMULA PO) Oral  . Omega-3 Fatty Acids (FISH OIL) 1000 MG CAPS Oral  . rosuvastatin (CRESTOR) 10 mg, Oral, Daily  . saw palmetto 160 mg, Oral, Daily  . Synthroid 100 mcg, Oral, Daily before breakfast  . vitamin C (ASCORBIC ACID) 500 mg, Oral, Daily    Cardiac Studies:   Echocardiogram 11/14/2015:   Left ventricle cavity is normal in size. Normal global wall motion. Normal diastolic filling pattern. Calculated EF 64%. Left atrial cavity is mildly dilated. Trace mitral regurgitation. Trace tricuspid regurgitation. No evidence of pulmonary hypertension. Essentially normal echocardiogram.  Assessment     ICD-10-CM   1. Hyponatremia  E87.1 Osmolality, urine    Osmolality    Sodium    POCT Urinalysis Dipstick    Basic metabolic panel    Osmolality  2. 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 S/P ablation of atrial fibrillation  Z98.890 EKG 12-Lead   Z86.79  Discontinue Ollmesartan, Probable cause for hyponatremia  EKG 09/11/2019: Sinus rhythm with first-degree AV block at rate of 66 bpm, normal axis, no evidence of ischemia, normal QT interval.  Otherwise normal EKG.  Compared  to 07/31/2017, first-degree AV block new.   Recommendations:   Patient with hypertension, remote atrial fibrillation ablation in 2008 and has been on low-dose of flecainide since then, hypertension and noted to have asymptomatic hyponatremia and I had recommended recheck in a few weeks after I had reviewed his labs from PCP. He is presently maintaining sinus rhythm.    He did not allow [place] to check his blood pressure today.  Blood pressure has been elevated when he presented to the emergency room. Presented to ED with elevated BP and Na levels have dipped even further.   I have discontinued olmesartan, he'll obtain urinary osmolality, serum Osmolality and urinary specific gravity and serum and urinary sodium today.    Discontinue olmesartan and switching to amlodipine 5 g daily and hydralazine 25 mg p.o. t.i.d. p.r.n. for systolic blood pressure greater than 140 mmHg.  He will need repeat labs in 3-4 weeks for follow-up.  I'll like to see him back in 6 weeks.  Addendum: Labs reviewed, features so far consistent with SIADH. Patient aware of results and will f/u as previously scheduled.  "Total time spent with patient was  40 minutes and greater than 50% of that time was spent in face to face discussion, counseling and coordination care"   Adrian Prows, MD, [facility] 09/30/2019, 7:15 AM [facility]. [place] Pager: [phone] Office: [phone] If no answer Cell [phone]

## 2019-09-11 ENCOUNTER — Encounter: Payer: Self-pay | Admitting: Cardiology

## 2019-09-11 MED ORDER — HYDRALAZINE HCL 25 MG PO TABS: 25.0000 mg | ORAL_TABLET | Freq: Three times a day (TID) | ORAL | 2 refills | Status: DC

## 2019-09-12 LAB — OSMOLALITY, URINE: Osmolality, Ur: 411 mOsmol/kg

## 2019-09-12 LAB — SODIUM: Sodium: 133 mmol/L — ABNORMAL LOW (ref 134–144)

## 2019-09-12 LAB — OSMOLALITY: Osmolality Meas: 271 mOsmol/kg — ABNORMAL LOW (ref 280–301)

## 2019-09-28 DIAGNOSIS — T148XXA Other injury of unspecified body region, initial encounter: Secondary | ICD-10-CM | POA: Diagnosis not present

## 2019-09-28 DIAGNOSIS — Z85828 Personal history of other malignant neoplasm of skin: Secondary | ICD-10-CM | POA: Diagnosis not present

## 2019-09-28 DIAGNOSIS — C44729 Squamous cell carcinoma of skin of left lower limb, including hip: Secondary | ICD-10-CM | POA: Diagnosis not present

## 2019-09-28 DIAGNOSIS — Z08 Encounter for follow-up examination after completed treatment for malignant neoplasm: Secondary | ICD-10-CM | POA: Diagnosis not present

## 2019-10-11 DIAGNOSIS — E039 Hypothyroidism, unspecified: Secondary | ICD-10-CM | POA: Diagnosis not present

## 2019-10-11 DIAGNOSIS — I129 Hypertensive chronic kidney disease with stage 1 through stage 4 chronic kidney disease, or unspecified chronic kidney disease: Secondary | ICD-10-CM | POA: Diagnosis not present

## 2019-10-11 DIAGNOSIS — Z8679 Personal history of other diseases of the circulatory system: Secondary | ICD-10-CM | POA: Diagnosis not present

## 2019-10-11 DIAGNOSIS — M5380 Other specified dorsopathies, site unspecified: Secondary | ICD-10-CM | POA: Diagnosis not present

## 2019-10-11 DIAGNOSIS — N183 Chronic kidney disease, stage 3 unspecified: Secondary | ICD-10-CM | POA: Diagnosis not present

## 2019-10-11 DIAGNOSIS — E785 Hyperlipidemia, unspecified: Secondary | ICD-10-CM | POA: Diagnosis not present

## 2019-10-11 DIAGNOSIS — E871 Hypo-osmolality and hyponatremia: Secondary | ICD-10-CM | POA: Diagnosis not present

## 2019-10-25 ENCOUNTER — Other Ambulatory Visit: Payer: Self-pay

## 2019-10-25 ENCOUNTER — Encounter: Payer: Self-pay | Admitting: Cardiology

## 2019-10-25 ENCOUNTER — Ambulatory Visit (INDEPENDENT_AMBULATORY_CARE_PROVIDER_SITE_OTHER): Payer: Medicare Other | Admitting: Cardiology

## 2019-10-25 VITALS — BP 115/71 | HR 87 | Ht 68.0 in | Wt 149.6 lb

## 2019-10-25 DIAGNOSIS — E871 Hypo-osmolality and hyponatremia: Secondary | ICD-10-CM | POA: Diagnosis not present

## 2019-10-25 DIAGNOSIS — I10 Essential (primary) hypertension: Secondary | ICD-10-CM

## 2019-10-25 NOTE — Progress Notes (Signed)
Primary Physician/Referring:  Burnard Bunting, MD  Patient ID: Jeffery Garrett, male    DOB: [birth year], 78 y.o.   MRN: [id number]  Chief Complaint  Patient presents with  . Hypertension  . Follow-up    6 week  . Results    lab   HPI:    Jeffery Garrett  is a 78 y.o.  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 No recurrence since and has been on los dose Flecainide daily for many years. He continues to lead an active lifestyle, bikes 15-20 miles 2-3 times a week and also hikes mountains frequently.   He has had occasional palpitations lasting a few seconds that he is aware that there are PACs and PVCs. He also has benign essential tremors.   He presented to the emergency department on 09/14/2019 with elevated blood pressure, not feeling well and was found to have worsening hyponatremia, blood pressure was uncontrolled, discharged home with recommendation to follow-up with [place] for management of hypertension and hyponatremia. I had discontinued losartan and switched to amlodipine.  He is tolerating this well and states that his blood pressure is well controlled.  Repeat BMP had revealed improvement in serum sodium.  States that well.  Gone on a hike, suddenly felt that he had no strength and took some glucose and felt better immediately.  But was concerned that he has not had this, feeling in a while and he is wondering whether it may be related to any pancreatic mass.  Past Medical History:  Diagnosis Date  . A-fib ([place])   . Hyperlipidemia   . Hypertension    Past Surgical History:  Procedure Laterality Date  . ABLATION  2007  . ATRIAL FIBRILLATION ABLATION    . neck fusion  1995   Social History   Socioeconomic History  . Marital status: Married    Spouse name: Not on file  . Number of children: 2  . Years of education: Not  on file  . Highest education level: Not on file  Occupational History  . Not on file  Social Needs  . Financial resource strain: Not on file  . Food insecurity    Worry: Not on file    Inability: Not on file  . Transportation needs    Medical: Not on file    Non-medical: Not on file  Tobacco Use  . Smoking status: Never Smoker  . Smokeless tobacco: Never Used  Substance and Sexual Activity  . Alcohol use: No  . Drug use: No  . Sexual activity: Not on file  Lifestyle  . Physical activity    Days per week: Not on file    Minutes per session: Not on file  . Stress: Not on file  Relationships  . Social Herbalist on phone: Not on file    Gets together: Not on file    Attends religious service: Not on file    Active member of club or organization: Not on file    Attends meetings of clubs or organizations: Not on file    Relationship status: Not on file  . Intimate partner violence    Fear of current or ex partner: Not on file    Emotionally abused: Not on file    Physically abused: Not on file    Forced sexual activity: Not on file  Other Topics  Concern  . Not on file  Social History Narrative   Patient resides with wife, consumes diet coke   ROS  Review of Systems  Constitution: Negative for chills, decreased appetite, malaise/fatigue and weight gain.  Cardiovascular: Negative for dyspnea on exertion, leg swelling and syncope.  Endocrine: Negative for cold intolerance.  Hematologic/Lymphatic: Does not bruise/bleed easily.  Musculoskeletal: Negative for joint swelling.  Gastrointestinal: Negative for abdominal pain, anorexia, change in bowel habit, hematochezia and melena.  Neurological: Positive for tremors. Negative for headaches and light-headedness.  Psychiatric/Behavioral: Negative for depression and substance abuse.  All other systems reviewed and are negative.  Objective  Blood pressure 115/71, pulse 87, height 5\' 8"  (1.727 m), weight 149 lb 9.6 oz  (67.9 kg). Body mass index is 22.75 kg/m. Patient refused BP check.   Vitals with BMI 10/25/2019 09/10/2019 09/04/2019  Height 5\' 8"  5\' 8"  -  Weight 149 lbs 10 oz 143 lbs 8 oz -  BMI [id number] -  Systolic [id number] - [id number]  Diastolic 71 - 90  Pulse 87 78 64    Physical Exam  Constitutional: He appears well-developed and well-nourished. No distress.  HENT:  Head: Atraumatic.  Eyes: Conjunctivae are normal.  Neck: Neck supple. No JVD present. No thyromegaly present.  Cardiovascular: Regular rhythm, S1 normal, S2 normal and intact distal pulses. Bradycardia present. Exam reveals no gallop.  Murmur heard.  Midsystolic murmur is present with a grade of 2/6 at the upper right sternal border. Pulmonary/Chest: Effort normal and breath sounds normal.  Abdominal: Soft. Bowel sounds are normal.  Musculoskeletal: Normal range of motion.        General: No edema.  Neurological: He is alert.  Skin: Skin is warm and dry.  Psychiatric: He has a normal mood and affect.   Radiology: No results found.  Laboratory examination:    Ref Range & Units 09/10/19  Osmolality Meas 280 - 301 mOsmol/kg 271Low      Ref Range & Units 09/10/19 9d ago  Sodium 134 - 144 mmol/L 133Low   124Low     Ref Range & Units 09/10/19  Osmolality, Ur mOsmol/kg 411 Random:  50 - 1400     CMP Latest Ref Rng & Units 09/10/2019 09/04/2019  Glucose 70 - 99 mg/dL - 113(H)  BUN 8 - 23 mg/dL - 16  Creatinine 0.61 - 1.24 mg/dL - 1.11  Sodium 134 - 144 mmol/L 133(L) 124(L)  Potassium 3.5 - 5.1 mmol/L - 4.0  Chloride 98 - 111 mmol/L - 90(L)  CO2 22 - 32 mmol/L - 23  Calcium 8.9 - 10.3 mg/dL - 8.8(L)  Total Protein 6.5 - 8.1 g/dL - 7.6  Total Bilirubin 0.3 - 1.2 mg/dL - 0.9  Alkaline Phos 38 - 126 U/L - 31(L)  AST 15 - 41 U/L - 39  ALT 0 - 44 U/L - 23   CBC Latest Ref Rng & Units 09/04/2019  WBC 4.0 - 10.5 K/uL 8.1  Hemoglobin 13.0 - 17.0 g/dL 12.3(L)  Hematocrit 39.0 - 52.0 % 35.5(L)  Platelets 150 - 400 K/uL 177    Lipid Panel & TSH  Labs 04/05/2019: Serum glucose 97 mg, BUN 20, creatinine 1.1, sodium 129, potassium 4.5, CMP otherwise normal, CBC normal.   Total cholesterol 153, triglycerides 90, HDL 50, LDL 82, nondistended cholesterol 100.  TSH, free T4 and PSA normal.  Medications   Prior to Admission medications   Medication Sig Start Date End Date Taking? Authorizing Provider  amLODipine (NORVASC) 5 MG tablet  08/23/14   [provider]  aspirin 81 MG tablet Take 81 mg by mouth daily.    [provider]  Cholecalciferol (D3-1000) 25 MCG (1000 UT) capsule Take 1,000 Units by mouth daily.    [provider]  Coenzyme Q10 (COQ10) 100 MG CAPS Take by mouth.    [provider]  flecainide (TAMBOCOR) 50 MG tablet TAKE 1 TABLET BY MOUTH DAILY 05/10/19   Adrian Prows, MD  ibuprofen (ADVIL) 800 MG tablet Take 800 mg by mouth every 8 (eight) hours as needed.    [provider]  Magnesium Oxide 500 MG CAPS Take by mouth.    [provider]  nebivolol (BYSTOLIC) 5 MG tablet Take 5 mg by mouth daily.    [provider]  Nutritional Supplements (PROSTATE ENZYME FORMULA PO) Take by mouth.    [provider]  olmesartan (BENICAR) 40 MG tablet Take 1 tablet (40 mg total) by mouth daily. 05/07/19   Adrian Prows, MD  Omega-3 Fatty Acids (FISH OIL) 1000 MG CAPS Take by mouth.    [provider]  permethrin (ELIMITE) 5 % cream  07/28/14   [provider]  primidone (MYSOLINE) 50 MG tablet 1/2 pill each bedtime x 1 week, then 1 pill nightly x 1 week, then 1 1/2 pills nightly x 1 week, then 2 pills nightly thereafter. 08/30/14   Star Age, MD  rosuvastatin (CRESTOR) 10 MG tablet Take 1 tablet (10 mg total) by mouth daily. 03/29/19   Miquel Dunn, NP  SYNTHROID 100 MCG tablet  08/15/14   [provider]  vitamin C (ASCORBIC ACID) 500 MG tablet Take 500 mg by mouth daily.    [provider]     Current Outpatient  Medications  Medication Instructions  . amLODipine (NORVASC) 5 mg, Oral, Daily  . aspirin 81 mg, Daily  . Cholecalciferol (D3-1000) 1,000 Units, Oral, Daily  . Coenzyme Q10 (COQ10 PO) 300 mg, Oral, Daily  . flecainide (TAMBOCOR) 50 MG tablet TAKE 1 TABLET BY MOUTH DAILY  . hydrALAZINE (APRESOLINE) 25 mg, Oral, 3 times daily, For SBP >140 mm Hg Can take 2 additional tablets  . ibuprofen (ADVIL) 800 mg, Oral, Every 8 hours PRN  . Magnesium Oxide 500 MG CAPS Oral, occasionally  . Nutritional Supplements (PROSTATE ENZYME FORMULA PO) Oral  . Omega-3 Fatty Acids (FISH OIL) 1000 MG CAPS Oral  . rosuvastatin (CRESTOR) 10 mg, Oral, Daily  . Synthroid 100 mcg, Oral, Daily before breakfast  . vitamin C (ASCORBIC ACID) 500 mg, Oral, Daily    Cardiac Studies:   Echocardiogram 11/14/2015:   Left ventricle cavity is normal in size. Normal global wall motion. Normal diastolic filling pattern. Calculated EF 64%. Left atrial cavity is mildly dilated. Trace mitral regurgitation. Trace tricuspid regurgitation. No evidence of pulmonary hypertension. Essentially normal echocardiogram.  Assessment     ICD-10-CM   1. Hyponatremia  123456 Basic Metabolic Panel (BMET)    Osmolality  2. Essential hypertension  I10     Discontinue Ollmesartan, Probable cause for hyponatremia  EKG 09/11/2019: Sinus rhythm with first-degree AV block at rate of 66 bpm, normal axis, no evidence of ischemia, normal QT interval.  Otherwise normal EKG.  Compared to 07/31/2017, first-degree AV block new.   Recommendations:   Patient with hypertension, remote atrial fibrillation ablation in 2008 and has been on low-dose of flecainide since then, hypertension and noted to have asymptomatic hyponatremia.  Patient presents here for 6 week office visit and follow-up of  hypertension and hyponatremia, since discontinuing ARB, serum sodium levels improved.  Blood pressure is also well controlled on amlodipine.  Continue present  medications.  I will repeat BMP today and also perform random serum osmolality.  Unless markedly abnormal, then he'll need workup for SIADH.  Patient also states that he has had one episode of sudden onset marked generalized weakness which responded to taking glucose tablets which appears to be hyperglycemia.  Is concerned about pancreatic cancer.  He will let and also has any other recurrence of similar episode.  Adrian Prows, MD, [facility] 10/25/2019, 1:51 PM [facility]. [place] Pager: [phone] Office: [phone] If no answer Cell [phone]

## 2019-10-26 LAB — BASIC METABOLIC PANEL
BUN/Creatinine Ratio: 10 (ref 10–24)
BUN: 12 mg/dL (ref 8–27)
CO2: 23 mmol/L (ref 20–29)
Calcium: 8.9 mg/dL (ref 8.6–10.2)
Chloride: 96 mmol/L (ref 96–106)
Creatinine, Ser: 1.17 mg/dL (ref 0.76–1.27)
GFR calc Af Amer: 68 mL/min/{1.73_m2} (ref >59)
GFR calc non Af Amer: 59 mL/min/{1.73_m2} — ABNORMAL LOW (ref >59)
Glucose: 124 mg/dL — ABNORMAL HIGH (ref 65–99)
Potassium: 4.4 mmol/L (ref 3.5–5.2)
Sodium: 136 mmol/L (ref 134–144)

## 2019-10-26 LAB — OSMOLALITY: Osmolality Meas: 278 mOsmol/kg — ABNORMAL LOW (ref 280–301)

## 2019-11-01 ENCOUNTER — Other Ambulatory Visit: Payer: Self-pay | Admitting: Cardiology

## 2019-12-06 ENCOUNTER — Other Ambulatory Visit: Payer: Self-pay | Admitting: Cardiology

## 2019-12-06 DIAGNOSIS — I10 Essential (primary) hypertension: Secondary | ICD-10-CM

## 2019-12-31 DIAGNOSIS — R5383 Other fatigue: Secondary | ICD-10-CM | POA: Diagnosis not present

## 2019-12-31 DIAGNOSIS — I129 Hypertensive chronic kidney disease with stage 1 through stage 4 chronic kidney disease, or unspecified chronic kidney disease: Secondary | ICD-10-CM | POA: Diagnosis not present

## 2019-12-31 DIAGNOSIS — Z20818 Contact with and (suspected) exposure to other bacterial communicable diseases: Secondary | ICD-10-CM | POA: Diagnosis not present

## 2019-12-31 DIAGNOSIS — R05 Cough: Secondary | ICD-10-CM | POA: Diagnosis not present

## 2019-12-31 DIAGNOSIS — N182 Chronic kidney disease, stage 2 (mild): Secondary | ICD-10-CM | POA: Diagnosis not present

## 2020-01-05 DIAGNOSIS — I129 Hypertensive chronic kidney disease with stage 1 through stage 4 chronic kidney disease, or unspecified chronic kidney disease: Secondary | ICD-10-CM | POA: Diagnosis not present

## 2020-01-05 DIAGNOSIS — E871 Hypo-osmolality and hyponatremia: Secondary | ICD-10-CM | POA: Diagnosis not present

## 2020-01-05 DIAGNOSIS — N1831 Chronic kidney disease, stage 3a: Secondary | ICD-10-CM | POA: Diagnosis not present

## 2020-01-05 DIAGNOSIS — R140 Abdominal distension (gaseous): Secondary | ICD-10-CM | POA: Diagnosis not present

## 2020-01-05 DIAGNOSIS — Z20818 Contact with and (suspected) exposure to other bacterial communicable diseases: Secondary | ICD-10-CM | POA: Diagnosis not present

## 2020-01-05 DIAGNOSIS — R05 Cough: Secondary | ICD-10-CM | POA: Diagnosis not present

## 2020-01-05 DIAGNOSIS — R110 Nausea: Secondary | ICD-10-CM | POA: Diagnosis not present

## 2020-01-06 DIAGNOSIS — R140 Abdominal distension (gaseous): Secondary | ICD-10-CM | POA: Diagnosis not present

## 2020-01-19 ENCOUNTER — Other Ambulatory Visit: Payer: Self-pay | Admitting: Cardiology

## 2020-01-19 DIAGNOSIS — E871 Hypo-osmolality and hyponatremia: Secondary | ICD-10-CM

## 2020-01-19 DIAGNOSIS — I10 Essential (primary) hypertension: Secondary | ICD-10-CM

## 2020-01-20 LAB — URINALYSIS, ROUTINE W REFLEX MICROSCOPIC
Bilirubin, UA: NEGATIVE
Glucose, UA: NEGATIVE
Ketones, UA: NEGATIVE
Leukocytes,UA: NEGATIVE
Nitrite, UA: NEGATIVE
RBC, UA: NEGATIVE
Specific Gravity, UA: 1.017 (ref 1.005–1.030)
Urobilinogen, Ur: 1 mg/dL (ref 0.2–1.0)
pH, UA: 7.5 (ref 5.0–7.5)

## 2020-01-20 LAB — SODIUM, URINE, RANDOM: Sodium, Ur: 74 mmol/L

## 2020-01-21 LAB — OSMOLALITY: Osmolality Meas: 269 mOsmol/kg — ABNORMAL LOW (ref 280–301)

## 2020-01-21 NOTE — Progress Notes (Signed)
Patient called.  Patient aware. Advised him to restrict free water

## 2020-01-24 ENCOUNTER — Ambulatory Visit: Payer: Medicare Other | Admitting: Cardiology

## 2020-01-24 ENCOUNTER — Encounter: Payer: Self-pay | Admitting: Cardiology

## 2020-01-24 ENCOUNTER — Other Ambulatory Visit: Payer: Self-pay

## 2020-01-24 VITALS — BP 115/63 | HR 68 | Temp 97.9°F | Ht 68.0 in | Wt 140.3 lb

## 2020-01-24 DIAGNOSIS — E871 Hypo-osmolality and hyponatremia: Secondary | ICD-10-CM

## 2020-01-24 DIAGNOSIS — I10 Essential (primary) hypertension: Secondary | ICD-10-CM

## 2020-01-24 DIAGNOSIS — R002 Palpitations: Secondary | ICD-10-CM

## 2020-01-24 NOTE — Progress Notes (Signed)
Primary Physician/Referring:  Burnard Bunting, MD  Patient ID: Jeffery Garrett, male    DOB: [birth year], 79 y.o.   MRN: [id number]  Chief Complaint  Patient presents with  . Hypertension  . Hyponatremia   HPI:    Jeffery Garrett  is a 79 y.o.   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 He continues to lead an active lifestyle, bikes 15-20 miles 2-3 times a week and also hikes mountains frequently.    Since receiving COVID-19 vaccine in January 2021, states that he has been having worsening exacerbation of his chronic skin rash, worsening palpitations in the form of skipped beats, fatigue, abdominal bloating, and worsening tremors.  States that most of the symptoms are gradually improving but still he does not feel himself.  He also lost about 6 to 8 pounds in weight.  Over the weekend I had repeated serum sodium which is markedly reduced which he attributes to the vaccine as well.  Wanted to be seen on an urgent basis.  No chest pain, shortness of breath, no leg edema.  Past Medical History:  Diagnosis Date  . A-fib ([place])   . Hyperlipidemia   . Hypertension    Past Surgical History:  Procedure Laterality Date  . ABLATION  2007  . ATRIAL FIBRILLATION ABLATION    . neck fusion  1995   Social History   Socioeconomic History  . Marital status: Married    Spouse name: Not on file  . Number of children: 2  . Years of education: Not on file  . Highest education level: Not on file  Occupational History  . Not on file  Tobacco Use  . Smoking status: Never Smoker  . Smokeless tobacco: Never Used  Substance and Sexual Activity  . Alcohol use: No  . Drug use: No  . Sexual activity: Not on file  Other Topics Concern  . Not on file  Social History Narrative   Patient resides with wife,  consumes diet coke   Social Determinants of Health   Financial Resource Strain:   . Difficulty of Paying Living Expenses: Not on file  Food Insecurity:   . Worried About Charity fundraiser in the Last Year: Not on file  . Ran Out of Food in the Last Year: Not on file  Transportation Needs:   . Lack of Transportation (Medical): Not on file  . Lack of Transportation (Non-Medical): Not on file  Physical Activity:   . Days of Exercise per Week: Not on file  . Minutes of Exercise per Session: Not on file  Stress:   . Feeling of Stress : Not on file  Social Connections:   . Frequency of Communication with Friends and Family: Not on file  . Frequency of Social Gatherings with Friends and Family: Not on file  . Attends Religious Services: Not on file  . Active Member of Clubs or Organizations: Not on file  . Attends Archivist Meetings: Not on file  . Marital Status: Not on file  Intimate Partner Violence:   . Fear of Current or Ex-Partner: Not on file  . Emotionally Abused: Not on file  . Physically Abused: Not on file  . Sexually Abused: Not on file   ROS  Review of Systems  Constitution: Negative for malaise/fatigue.  Cardiovascular: Positive  for palpitations. Negative for dyspnea on exertion and leg swelling.  Respiratory: Positive for cough.   Gastrointestinal: Negative for melena and nausea.  Neurological: Positive for tremors.  Psychiatric/Behavioral: The patient is nervous/anxious.    Objective  Blood pressure 115/63, pulse 68, temperature 97.9 F (36.6 C), height 5\' 8"  (1.727 m), weight 140 lb 4.8 oz (63.6 kg), SpO2 98 %. Body mass index is 21.33 kg/m.   Vitals with BMI 01/24/2020 10/25/2019 09/10/2019  Height 5\' 8"  5\' 8"  5\' 8"   Weight 140 lbs 5 oz 149 lbs 10 oz 143 lbs 8 oz  BMI 21.34 [id number]  Systolic [id number] -  Diastolic 63 71 -  Pulse 68 87 78    Physical Exam  Constitutional: He appears well-developed and well-nourished. No distress.   Cardiovascular: Regular rhythm, S1 normal, S2 normal and intact distal pulses. Bradycardia present. Exam reveals no gallop.  Murmur heard.  Midsystolic murmur is present with a grade of 2/6 at the upper right sternal border. Pulmonary/Chest: Effort normal and breath sounds normal.  Abdominal: Soft. Bowel sounds are normal.  Psychiatric: He has a normal mood and affect.   Radiology: No results found.  Laboratory examination:   Urinalysis    Component Value Date/Time   COLORURINE YELLOW 09/04/2019 2000   APPEARANCEUR Clear 01/19/2020 1407   LABSPEC <1.005 (L) 09/04/2019 2000   PHURINE 7.0 09/04/2019 2000   GLUCOSEU Negative 01/19/2020 1407   HGBUR NEGATIVE 09/04/2019 2000   BILIRUBINUR Negative 01/19/2020 [place] 09/04/2019 2000   PROTEINUR Trace 01/19/2020 1407   PROTEINUR NEGATIVE 09/04/2019 2000   NITRITE Negative 01/19/2020 1407   NITRITE NEGATIVE 09/04/2019 2000   LEUKOCYTESUR Negative 01/19/2020 1407   LEUKOCYTESUR NEGATIVE 09/04/2019 2000      Ref Range & Units 01/19/2020 3 mo ago 4 mo ago  Osmolality Meas 280 - 301 mOsmol/kg 269Low   278Low   271Low    Resulting Agency  [facility]      Ref Range & Units 09/10/19  Osmolality Meas 280 - 301 mOsmol/kg 271Low      Ref Range & Units 09/10/19 9d ago  Sodium 134 - 144 mmol/L 133Low   124Low     Ref Range & Units 09/10/19  Osmolality, Ur mOsmol/kg 411 Random:  50 - 1400     CMP Latest Ref Rng & Units 10/25/2019 09/10/2019 09/04/2019  Glucose 65 - 99 mg/dL 124(H) - 113(H)  BUN 8 - 27 mg/dL 12 - 16  Creatinine 0.76 - 1.27 mg/dL 1.17 - 1.11  Sodium 134 - 144 mmol/L 136 133(L) 124(L)  Potassium 3.5 - 5.2 mmol/L 4.4 - 4.0  Chloride 96 - 106 mmol/L 96 - 90(L)  CO2 20 - 29 mmol/L 23 - 23  Calcium 8.6 - 10.2 mg/dL 8.9 - 8.8(L)  Total Protein 6.5 - 8.1 g/dL - - 7.6  Total Bilirubin 0.3 - 1.2 mg/dL - - 0.9  Alkaline Phos 38 - 126 U/L - - 31(L)  AST 15 - 41 U/L - - 39  ALT 0 - 44 U/L - - 23   CBC Latest  Ref Rng & Units 09/04/2019  WBC 4.0 - 10.5 K/uL 8.1  Hemoglobin 13.0 - 17.0 g/dL 12.3(L)  Hematocrit 39.0 - 52.0 % 35.5(L)  Platelets 150 - 400 K/uL 177   Lipid Panel & TSH  Labs 04/05/2019: Serum glucose 97 mg, BUN 20, creatinine 1.1, sodium 129, potassium 4.5, CMP otherwise normal, CBC normal.   Total cholesterol 153, triglycerides 90, HDL 50,  LDL 82, nondistended cholesterol 100.  TSH, free T4 and PSA normal.  Medications   Current Outpatient Medications  Medication Instructions  . amLODipine (NORVASC) 5 MG tablet TAKE 1 TABLET(5 MG) BY MOUTH DAILY  . aspirin 81 mg, Daily  . Cholecalciferol (D3-1000) 1,000 Units, Oral, Daily  . clobetasol cream (TEMOVATE) 0.05 % As needed  . Coenzyme Q10 (COQ10 PO) 300 mg, Oral, Daily  . flecainide (TAMBOCOR) 50 MG tablet TAKE 1 TABLET BY MOUTH DAILY  . hydrALAZINE (APRESOLINE) 25 mg, Oral, 3 times daily, For SBP >140 mm Hg Can take 2 additional tablets  . ibuprofen (ADVIL) 800 mg, Oral, Every 8 hours PRN  . Magnesium Oxide 500 MG CAPS Oral, occasionally  . nebivolol (BYSTOLIC) 2.5 mg, Oral, Daily  . Nutritional Supplements (PROSTATE ENZYME FORMULA PO) Oral  . Omega-3 Fatty Acids (FISH OIL) 1000 MG CAPS Oral  . rosuvastatin (CRESTOR) 10 mg, Oral, Daily  . Synthroid 100 mcg, Oral, Daily before breakfast  . vitamin C (ASCORBIC ACID) 500 mg, Oral, Daily    Cardiac Studies:   Echocardiogram 11/14/2015:   Left ventricle cavity is normal in size. Normal global wall motion. Normal diastolic filling pattern. Calculated EF 64%. Left atrial cavity is mildly dilated. Trace mitral regurgitation. Trace tricuspid regurgitation. No evidence of pulmonary hypertension. Essentially normal echocardiogram.  Assessment     ICD-10-CM   1. Palpitations  R00.2   2. Hyponatremia  123456 Basic metabolic panel    Basic metabolic panel  3. Essential hypertension  I10     Discontinue Ollmesartan, Probable cause for hyponatremia  EKG 09/11/2019: Sinus rhythm  with first-degree AV block at rate of 66 bpm, normal axis, no evidence of ischemia, normal QT interval.  Otherwise normal EKG.  Compared to 07/31/2017, first-degree AV block new.   Recommendations:   Jeffery Garrett  is a 79 y.o. 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 He continues to lead an active lifestyle, bikes 15-20 miles 2-3 times a week and also hikes mountains frequently.   He had called me over the weekend stating that after he received Covid vaccine, he is developed severe immunologic reaction including worsening skin rash, worsening tremors, worsening abdominal bloating, weight loss of 6 to 8 pounds.  This started about 6 weeks ago and is presently gradually improving.  He also attributes hyponatremia to vaccine, I performed serum osmolality and also urinary specific gravity along with serum sodium which suggest dilutional hyponatremia.  As he is an avid athlete and drinks excessively, advised him to restrict free water and we will recheck his sodium levels in 2 weeks and I would like to see him back in 4 weeks.  Otherwise blood pressure is well controlled and physical examination is unchanged.  He has started Bystolic 2.5 mg daily for worsening palpitations in the form of skipped beats which advised him to continue for now.   Adrian Prows, MD, [facility] 01/25/2020, 6:33 AM [facility]

## 2020-02-01 ENCOUNTER — Other Ambulatory Visit: Payer: Self-pay | Admitting: Cardiology

## 2020-02-01 DIAGNOSIS — G252 Other specified forms of tremor: Secondary | ICD-10-CM

## 2020-02-01 DIAGNOSIS — G250 Essential tremor: Secondary | ICD-10-CM

## 2020-02-01 DIAGNOSIS — E871 Hypo-osmolality and hyponatremia: Secondary | ICD-10-CM | POA: Diagnosis not present

## 2020-02-02 ENCOUNTER — Encounter: Payer: Self-pay | Admitting: Neurology

## 2020-02-02 LAB — BASIC METABOLIC PANEL
BUN/Creatinine Ratio: 19 (ref 10–24)
BUN: 20 mg/dL (ref 8–27)
CO2: 24 mmol/L (ref 20–29)
Calcium: 9.4 mg/dL (ref 8.6–10.2)
Chloride: 105 mmol/L (ref 96–106)
Creatinine, Ser: 1.06 mg/dL (ref 0.76–1.27)
GFR calc Af Amer: 76 mL/min/{1.73_m2} (ref >59)
GFR calc non Af Amer: 66 mL/min/{1.73_m2} (ref >59)
Glucose: 78 mg/dL (ref 65–99)
Potassium: 5.1 mmol/L (ref 3.5–5.2)
Sodium: 141 mmol/L (ref 134–144)

## 2020-02-23 ENCOUNTER — Other Ambulatory Visit: Payer: Self-pay | Admitting: Cardiology

## 2020-02-23 DIAGNOSIS — I10 Essential (primary) hypertension: Secondary | ICD-10-CM

## 2020-02-24 ENCOUNTER — Ambulatory Visit: Payer: Medicare Other | Admitting: Cardiology

## 2020-03-07 ENCOUNTER — Encounter: Payer: Self-pay | Admitting: Cardiology

## 2020-03-07 ENCOUNTER — Other Ambulatory Visit: Payer: Self-pay

## 2020-03-07 ENCOUNTER — Ambulatory Visit: Payer: Medicare Other | Admitting: Cardiology

## 2020-03-07 VITALS — BP 138/81 | HR 63 | Ht 68.0 in | Wt 144.0 lb

## 2020-03-07 DIAGNOSIS — I10 Essential (primary) hypertension: Secondary | ICD-10-CM | POA: Diagnosis not present

## 2020-03-07 DIAGNOSIS — R002 Palpitations: Secondary | ICD-10-CM

## 2020-03-07 DIAGNOSIS — E871 Hypo-osmolality and hyponatremia: Secondary | ICD-10-CM | POA: Diagnosis not present

## 2020-03-07 NOTE — Progress Notes (Signed)
Assessment/Plan:   1.  Essential Tremor.  -This is evidenced by the symmetrical nature and longstanding hx of gradually getting worse.  We discussed nature and pathophysiology.  We discussed that this can continue to gradually get worse with time.  We discussed that some medications can worsen this, as can caffeine use.  We discussed medication therapy as well as surgical therapy.   HIPAA compliant videos shown of patients who have had surgery.  Pt clear that he is not interested in DBS.  Asks about focused ultrasound and discussed this in detail and differences b/w focused ultrasound and DBS.   Ultimately, the patient decided to try the primidone and work to 50 mg bid.  R/B/SE were discussed.  The opportunity to ask questions was given and they were answered to the best of my ability.  The patient expressed understanding and willingness to follow the outlined treatment protocols.  Admits that he only gave it a few days in the past to work.  admits that he mostly came here to get information because he is "interested in technology" but doesn't think that he would like any surgery.  States that has learned to live with tremor mostly succuessfully.   Info given on readi steadi glove system.  Doesn't think that he is interested.   Subjective:   Jeffery Garrett was seen in consultation in the movement disorder clinic at the request of Adrian Prows, MD.  The evaluation is for tremor.  Outside records that were made available to me were reviewed.  I have also personally spoken to Dr. Einar Gip about this patient.  Patient previously saw Dr. Rexene Alberts for the same in 2015.  At that point in time, tremor had been going on for 10 or 15 years.  Pt estimates tremor estimates tremor has been going on for 30 years.  Dr. Rexene Alberts noted that tremor was mild, but mild to moderate when he wrote.  It was recommended to start primidone and work to 100 mg at night.  It does not appear that the patient followed up after that. Pt states  that he only took it on 2-3 occasions and he stopped it as he didn't notice a difference with it.   Tremor is mostly on the R hand and most with fine motor movements.  It is worse as the day goes on and worse with strenuous activity.  There is no known family hx of tremor.    Affected by caffeine:  No.  Affected by alcohol:  Does not drink enough to know Affected by stress:  Yes.   Affected by fatigue:  Yes.   Spills soup if on spoon:  May or may not (if he has done something strenuous he may have to use the L hand - he is R hand dominant)   Current/Previously tried tremor medications: primidone (only took 2-3 different occasionsl)  Current medications that may exacerbate tremor:  n/a  Outside reports reviewed: historical medical records, office notes and referral letter/letters.  No Known Allergies  Current Outpatient Medications  Medication Instructions  . amLODipine (NORVASC) 5 MG tablet TAKE 1 TABLET(5 MG) BY MOUTH DAILY  . aspirin 81 mg, Daily  . clobetasol cream (TEMOVATE) 0.05 % As needed  . Coenzyme Q10 (COQ10 PO) 300 mg, Oral, Daily  . flecainide (TAMBOCOR) 50 MG tablet TAKE 1 TABLET BY MOUTH DAILY  . ibuprofen (ADVIL) 800 mg, Oral, Every 8 hours PRN  . Magnesium Oxide 500 MG CAPS Oral, occasionally  . Nutritional Supplements (PROSTATE  ENZYME FORMULA PO) Oral  . Omega-3 Fatty Acids (FISH OIL) 1000 MG CAPS Oral  . rosuvastatin (CRESTOR) 10 mg, Oral, Daily  . Synthroid 100 mcg, Oral, Daily before breakfast  . vitamin C (ASCORBIC ACID) 500 mg, Oral, Daily     Objective:   VITALS:   Vitals:   03/09/20 1324  BP: (!) 161/84  Pulse: 75  SpO2: 99%  Weight: 144 lb (65.3 kg)  Height: 5\' 8"  (1.727 m)   Gen:  Appears stated age and in NAD. HEENT:  Normocephalic, atraumatic. The mucous membranes are moist. The superficial temporal arteries are without ropiness or tenderness. Cardiovascular: Regular rate and rhythm. Lungs: Clear to auscultation bilaterally. Neck: There  are no carotid bruits noted bilaterally.  NEUROLOGICAL:  Orientation:  The patient is alert and oriented x 3.   Cranial nerves: There is good facial symmetry. Extraocular muscles are intact and visual fields are full to confrontational testing. Speech is fluent and clear. Soft palate rises symmetrically and there is no tongue deviation. Hearing is intact to conversational tone. Tone: Tone is good throughout. Sensation: Sensation is intact to light touch touch throughout (facial, trunk, extremities). Vibration is intact at the bilateral big toe. There is no extinction with double simultaneous stimulation. There is no sensory dermatomal level identified. Coordination:  The patient has no dysdiadichokinesia or dysmetria. Motor: Strength is 5/5 in the bilateral upper and lower extremities.  Shoulder shrug is equal bilaterally.  There is no pronator drift.  There are no fasciculations noted. DTR's: Deep tendon reflexes are 2/4 at the bilateral biceps, triceps, brachioradialis, trace at the bilateral patella and achilles.  Plantar responses are downgoing bilaterally. Gait and Station: The patient is able to ambulate without difficulty. The patient is able to heel toe walk without any difficulty. The patient is able to ambulate in a tandem fashion. The patient is able to stand in the Romberg position.   MOVEMENT EXAM: Tremor:  There is no rest tremor.  There is mild postural tremor, right greater than left.  It is about the same when he is given a weight.  He does have trouble with Archimedes spirals bilaterally.  He has significant difficulty when he is attempting to write a sentence.  He has mild to moderate trouble with pouring water from 1 glass to another.  I have reviewed and interpreted the following labs independently   Chemistry      Component Value Date/Time   NA 136 03/08/2020 1549   K 4.1 03/08/2020 1549   CL 99 03/08/2020 1549   CO2 22 03/08/2020 1549   BUN 21 03/08/2020 1549    CREATININE 1.05 03/08/2020 1549      Component Value Date/Time   CALCIUM 9.7 03/08/2020 1549   ALKPHOS 31 (L) 09/04/2019 2006   AST 39 09/04/2019 2006   ALT 23 09/04/2019 2006   BILITOT 0.9 09/04/2019 2006     Lab Results  Component Value Date   WBC 8.1 09/04/2019   HGB 12.3 (L) 09/04/2019   HCT 35.5 (L) 09/04/2019   MCV 93.2 09/04/2019   PLT 177 09/04/2019        Total time spent on today's visit was 60 minutes, including both face-to-face time and nonface-to-face time.  Time included that spent on review of records (prior notes available to me/labs/imaging if pertinent), discussing treatment and goals, answering patient's questions and coordinating care.  CC:  Burnard Bunting, MD

## 2020-03-07 NOTE — Progress Notes (Signed)
Primary Physician/Referring:  Burnard Bunting, MD  Patient ID: Jeffery Garrett, male    DOB: [birth year], 79 y.o.   MRN: [id number]  No chief complaint on file.  HPI:    Jeffery Garrett  is a 79 y.o.   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 He continues to lead an active lifestyle, bikes 15-20 miles 2-3 times a week and also hikes mountains frequently.    Since receiving COVID-19 vaccine in January 2021, states that he has been having worsening exacerbation of his chronic skin rash, worsening palpitations in the form of skipped beats, fatigue, abdominal bloating, and worsening tremors.  I had seen him 6 weeks ago he now presents for follow-up and states that most of his symptoms except for rash has subsided, and is wondering whether he can now discontinue Bystolic which he is taking 2.5 mg daily.  Palpitation symptoms are improved.  He also presents for follow-up of hyponatremia.  His lab evaluation suggested dilutional hyponatremia as he is an avid cyclist, also drinks excessive amounts of water.  He has been restricting his fluid as per my recommendations.  Past Medical History:  Diagnosis Date  . A-fib ([place])   . Hyperlipidemia   . Hypertension    Past Surgical History:  Procedure Laterality Date  . ABLATION  2007  . ATRIAL FIBRILLATION ABLATION    . neck fusion  1995   Social History   Tobacco Use  . Smoking status: Never Smoker  . Smokeless tobacco: Never Used  Substance Use Topics  . Alcohol use: No   Marital status: Married   ROS  Review of Systems  Constitution: Negative for malaise/fatigue.  Cardiovascular: Positive for palpitations. Negative for dyspnea on exertion and leg swelling.  Gastrointestinal: Negative for melena and nausea.  Neurological: Positive for tremors.    Objective  There were no vitals taken for this visit. There is no height or weight on file to calculate BMI.   Vitals with BMI 01/24/2020 10/25/2019 09/10/2019  Height 5\' 8"  5\' 8"  5\' 8"   Weight 140 lbs 5 oz 149 lbs 10 oz 143 lbs 8 oz  BMI 21.34 [id number]  Systolic [id number] -  Diastolic 63 71 -  Pulse 68 87 78    Physical Exam  Constitutional: He appears well-developed and well-nourished. No distress.  Cardiovascular: Regular rhythm, S1 normal, S2 normal and intact distal pulses. Bradycardia present. Exam reveals no gallop.  Murmur heard.  Midsystolic murmur is present with a grade of 2/6 at the upper right sternal border. Pulmonary/Chest: Effort normal and breath sounds normal.  Abdominal: Soft. Bowel sounds are normal.  Psychiatric: He has a normal mood and affect.   Radiology: No results found.  Laboratory examination:   Urinalysis    Component Value Date/Time   COLORURINE YELLOW 09/04/2019 2000   APPEARANCEUR Clear 01/19/2020 1407   LABSPEC <1.005 (L) 09/04/2019 2000   PHURINE 7.0 09/04/2019 2000   GLUCOSEU Negative 01/19/2020 1407   HGBUR NEGATIVE 09/04/2019 2000   BILIRUBINUR Negative 01/19/2020 1407   [place] 09/04/2019 2000   PROTEINUR Trace 01/19/2020 1407   PROTEINUR NEGATIVE 09/04/2019 2000   NITRITE Negative 01/19/2020 1407   NITRITE NEGATIVE 09/04/2019 2000   LEUKOCYTESUR Negative 01/19/2020 1407   LEUKOCYTESUR NEGATIVE 09/04/2019 2000      Ref Range & Units  01/19/2020 3 mo ago 4 mo ago  Osmolality Meas 280 - 301 mOsmol/kg 269Low   278Low   271Low    Resulting Agency  [facility]      Ref Range & Units 09/10/19  Osmolality Meas 280 - 301 mOsmol/kg 271Low      Ref Range & Units 09/10/19 9d ago  Sodium 134 - 144 mmol/L 133Low   124Low     Ref Range & Units 09/10/19  Osmolality, Ur mOsmol/kg 411 Random:  50 - 1400     CMP Latest Ref Rng & Units 02/01/2020 10/25/2019 09/10/2019  Glucose 65 - 99 mg/dL 78 124(H) -  BUN 8 - 27 mg/dL 20 12 -   Creatinine 0.76 - 1.27 mg/dL 1.06 1.17 -  Sodium 134 - 144 mmol/L 141 136 133(L)  Potassium 3.5 - 5.2 mmol/L 5.1 4.4 -  Chloride 96 - 106 mmol/L 105 96 -  CO2 20 - 29 mmol/L 24 23 -  Calcium 8.6 - 10.2 mg/dL 9.4 8.9 -  Total Protein 6.5 - 8.1 g/dL - - -  Total Bilirubin 0.3 - 1.2 mg/dL - - -  Alkaline Phos 38 - 126 U/L - - -  AST 15 - 41 U/L - - -  ALT 0 - 44 U/L - - -   CBC Latest Ref Rng & Units 09/04/2019  WBC 4.0 - 10.5 K/uL 8.1  Hemoglobin 13.0 - 17.0 g/dL 12.3(L)  Hematocrit 39.0 - 52.0 % 35.5(L)  Platelets 150 - 400 K/uL 177   Lipid Panel & TSH  Labs 04/05/2019: Serum glucose 97 mg, BUN 20, creatinine 1.1, sodium 129, potassium 4.5, CMP otherwise normal, CBC normal.   Total cholesterol 153, triglycerides 90, HDL 50, LDL 82, nondistended cholesterol 100.  TSH, free T4 and PSA normal.  Medications   Current Outpatient Medications  Medication Instructions  . amLODipine (NORVASC) 5 MG tablet TAKE 1 TABLET(5 MG) BY MOUTH DAILY  . aspirin 81 mg, Daily  . Cholecalciferol (D3-1000) 1,000 Units, Oral, Daily  . clobetasol cream (TEMOVATE) 0.05 % As needed  . Coenzyme Q10 (COQ10 PO) 300 mg, Oral, Daily  . flecainide (TAMBOCOR) 50 MG tablet TAKE 1 TABLET BY MOUTH DAILY  . hydrALAZINE (APRESOLINE) 25 MG tablet TAKE 1 TABLET BY MOUTH THREE TIMES DAILY. FOR SYSTOLIC BLOOD [id number] YOU CAN TAKE 2 ADDITIONAL TABLETS  . ibuprofen (ADVIL) 800 mg, Oral, Every 8 hours PRN  . Magnesium Oxide 500 MG CAPS Oral, occasionally  . nebivolol (BYSTOLIC) 2.5 mg, Oral, Daily  . Nutritional Supplements (PROSTATE ENZYME FORMULA PO) Oral  . Omega-3 Fatty Acids (FISH OIL) 1000 MG CAPS Oral  . rosuvastatin (CRESTOR) 10 mg, Oral, Daily  . Synthroid 100 mcg, Oral, Daily before breakfast  . vitamin C (ASCORBIC ACID) 500 mg, Oral, Daily   Cardiac Studies:   Echocardiogram 11/14/2015:   Left ventricle cavity is normal in size. Normal global wall motion. Normal diastolic filling pattern. Calculated EF  64%. Left atrial cavity is mildly dilated. Trace mitral regurgitation. Trace tricuspid regurgitation. No evidence of pulmonary hypertension. Essentially normal echocardiogram.  Assessment     ICD-10-CM   1. Essential hypertension  I10   2. Dilutional hyponatremia  E87.1   3. Palpitations  R00.2     Discontinue Ollmesartan, Probable cause for hyponatremia  EKG 09/11/2019: Sinus rhythm with first-degree AV block at rate of 66 bpm, normal axis, no evidence of ischemia, normal QT interval.  Otherwise normal EKG.  Compared to 07/31/2017, first-degree AV block new.   Recommendations:  HARMONY ROSSNER  is a 79 y.o. 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 He continues to lead an active lifestyle, bikes 15-20 miles 2-3 times a week and also hikes mountains frequently. After he received Covid vaccine, he is developed severe immunologic reaction including worsening skin rash, worsening tremors, worsening abdominal bloating, weight loss of 6 to 8 pounds, frequent palpitations.   Fortunately he has recovered well, and since restricting free water, serum sodium levels have normalized.  He would like to recheck the levels, BMP was ordered today.  Blood pressure has been well controlled, home recordings have been 110 to 123456 mmHg systolic.  Since being on Bystolic 2.5 mg symptoms of palpitation improved but he would like to wean himself off of it which I do not have a problem.  I will see him back in 6 months for follow-up of hypertension and palpitations and remote AF.  Adrian Prows, MD, [facility] 03/07/2020, 5:51 AM [facility]

## 2020-03-08 DIAGNOSIS — L271 Localized skin eruption due to drugs and medicaments taken internally: Secondary | ICD-10-CM | POA: Diagnosis not present

## 2020-03-08 DIAGNOSIS — I10 Essential (primary) hypertension: Secondary | ICD-10-CM | POA: Diagnosis not present

## 2020-03-08 DIAGNOSIS — E871 Hypo-osmolality and hyponatremia: Secondary | ICD-10-CM | POA: Diagnosis not present

## 2020-03-09 ENCOUNTER — Ambulatory Visit (INDEPENDENT_AMBULATORY_CARE_PROVIDER_SITE_OTHER): Payer: Medicare Other | Admitting: Neurology

## 2020-03-09 ENCOUNTER — Encounter: Payer: Self-pay | Admitting: Neurology

## 2020-03-09 ENCOUNTER — Other Ambulatory Visit: Payer: Self-pay

## 2020-03-09 VITALS — BP 161/84 | HR 75 | Ht 68.0 in | Wt 144.0 lb

## 2020-03-09 DIAGNOSIS — G250 Essential tremor: Secondary | ICD-10-CM | POA: Diagnosis not present

## 2020-03-09 LAB — BASIC METABOLIC PANEL
BUN/Creatinine Ratio: 20 (ref 10–24)
BUN: 21 mg/dL (ref 8–27)
CO2: 22 mmol/L (ref 20–29)
Calcium: 9.7 mg/dL (ref 8.6–10.2)
Chloride: 99 mmol/L (ref 96–106)
Creatinine, Ser: 1.05 mg/dL (ref 0.76–1.27)
GFR calc Af Amer: 77 mL/min/{1.73_m2} (ref >59)
GFR calc non Af Amer: 67 mL/min/{1.73_m2} (ref >59)
Glucose: 109 mg/dL — ABNORMAL HIGH (ref 65–99)
Potassium: 4.1 mmol/L (ref 3.5–5.2)
Sodium: 136 mmol/L (ref 134–144)

## 2020-03-09 MED ORDER — PRIMIDONE 50 MG PO TABS: 50.0000 mg | ORAL_TABLET | Freq: Two times a day (BID) | ORAL | 1 refills | Status: DC

## 2020-03-09 NOTE — Patient Instructions (Addendum)
Start primidone 50 mg - 1/2 tablet at bedtime for 1 week and then increase to 1 tablet at bedtime for a week and then, if tolerated, you can increase to 1 tablet twice per day thereafter.  The physicians and staff at [facility] are committed to providing excellent care. You may receive a survey requesting feedback about your experience at our office. We strive to receive "very good" responses to the survey questions. If you feel that your experience would prevent you from giving the office a "very good " response, please contact our office to try to remedy the situation. We may be reached at [phone]. Thank you for taking the time out of your busy day to complete the survey.

## 2020-03-10 DIAGNOSIS — L271 Localized skin eruption due to drugs and medicaments taken internally: Secondary | ICD-10-CM | POA: Diagnosis not present

## 2020-04-10 DIAGNOSIS — E038 Other specified hypothyroidism: Secondary | ICD-10-CM | POA: Diagnosis not present

## 2020-04-10 DIAGNOSIS — E7849 Other hyperlipidemia: Secondary | ICD-10-CM | POA: Diagnosis not present

## 2020-04-10 DIAGNOSIS — Z125 Encounter for screening for malignant neoplasm of prostate: Secondary | ICD-10-CM | POA: Diagnosis not present

## 2020-04-24 DIAGNOSIS — Z0000 Encounter for general adult medical examination without abnormal findings: Secondary | ICD-10-CM | POA: Diagnosis not present

## 2020-04-24 DIAGNOSIS — I491 Atrial premature depolarization: Secondary | ICD-10-CM | POA: Diagnosis not present

## 2020-04-24 DIAGNOSIS — R82998 Other abnormal findings in urine: Secondary | ICD-10-CM | POA: Diagnosis not present

## 2020-04-24 DIAGNOSIS — M5380 Other specified dorsopathies, site unspecified: Secondary | ICD-10-CM | POA: Diagnosis not present

## 2020-04-24 DIAGNOSIS — I10 Essential (primary) hypertension: Secondary | ICD-10-CM | POA: Diagnosis not present

## 2020-04-24 DIAGNOSIS — M5416 Radiculopathy, lumbar region: Secondary | ICD-10-CM | POA: Diagnosis not present

## 2020-04-24 DIAGNOSIS — E785 Hyperlipidemia, unspecified: Secondary | ICD-10-CM | POA: Diagnosis not present

## 2020-04-24 DIAGNOSIS — N1831 Chronic kidney disease, stage 3a: Secondary | ICD-10-CM | POA: Diagnosis not present

## 2020-04-24 DIAGNOSIS — R21 Rash and other nonspecific skin eruption: Secondary | ICD-10-CM | POA: Diagnosis not present

## 2020-04-24 DIAGNOSIS — E039 Hypothyroidism, unspecified: Secondary | ICD-10-CM | POA: Diagnosis not present

## 2020-04-24 DIAGNOSIS — Z1331 Encounter for screening for depression: Secondary | ICD-10-CM | POA: Diagnosis not present

## 2020-04-24 DIAGNOSIS — Z1339 Encounter for screening examination for other mental health and behavioral disorders: Secondary | ICD-10-CM | POA: Diagnosis not present

## 2020-04-24 DIAGNOSIS — I129 Hypertensive chronic kidney disease with stage 1 through stage 4 chronic kidney disease, or unspecified chronic kidney disease: Secondary | ICD-10-CM | POA: Diagnosis not present

## 2020-04-26 DIAGNOSIS — Z1212 Encounter for screening for malignant neoplasm of rectum: Secondary | ICD-10-CM | POA: Diagnosis not present

## 2020-05-18 ENCOUNTER — Other Ambulatory Visit: Payer: Self-pay | Admitting: Cardiology

## 2020-05-19 DIAGNOSIS — C44319 Basal cell carcinoma of skin of other parts of face: Secondary | ICD-10-CM | POA: Diagnosis not present

## 2020-05-23 ENCOUNTER — Other Ambulatory Visit: Payer: Self-pay | Admitting: Cardiology

## 2020-05-23 DIAGNOSIS — I10 Essential (primary) hypertension: Secondary | ICD-10-CM

## 2020-06-16 DIAGNOSIS — Z08 Encounter for follow-up examination after completed treatment for malignant neoplasm: Secondary | ICD-10-CM | POA: Diagnosis not present

## 2020-06-16 DIAGNOSIS — L270 Generalized skin eruption due to drugs and medicaments taken internally: Secondary | ICD-10-CM | POA: Diagnosis not present

## 2020-06-16 DIAGNOSIS — L308 Other specified dermatitis: Secondary | ICD-10-CM | POA: Diagnosis not present

## 2020-06-16 DIAGNOSIS — C44729 Squamous cell carcinoma of skin of left lower limb, including hip: Secondary | ICD-10-CM | POA: Diagnosis not present

## 2020-06-16 DIAGNOSIS — Z85828 Personal history of other malignant neoplasm of skin: Secondary | ICD-10-CM | POA: Diagnosis not present

## 2020-06-30 DIAGNOSIS — Z85828 Personal history of other malignant neoplasm of skin: Secondary | ICD-10-CM | POA: Diagnosis not present

## 2020-06-30 DIAGNOSIS — Z08 Encounter for follow-up examination after completed treatment for malignant neoplasm: Secondary | ICD-10-CM | POA: Diagnosis not present

## 2020-06-30 DIAGNOSIS — L720 Epidermal cyst: Secondary | ICD-10-CM | POA: Diagnosis not present

## 2020-07-13 DIAGNOSIS — Z85828 Personal history of other malignant neoplasm of skin: Secondary | ICD-10-CM | POA: Diagnosis not present

## 2020-07-13 DIAGNOSIS — L0100 Impetigo, unspecified: Secondary | ICD-10-CM | POA: Diagnosis not present

## 2020-07-13 DIAGNOSIS — Z08 Encounter for follow-up examination after completed treatment for malignant neoplasm: Secondary | ICD-10-CM | POA: Diagnosis not present

## 2020-07-27 ENCOUNTER — Ambulatory Visit: Payer: No Typology Code available for payment source | Admitting: Cardiology

## 2020-08-04 DIAGNOSIS — Z08 Encounter for follow-up examination after completed treatment for malignant neoplasm: Secondary | ICD-10-CM | POA: Diagnosis not present

## 2020-08-04 DIAGNOSIS — Z85828 Personal history of other malignant neoplasm of skin: Secondary | ICD-10-CM | POA: Diagnosis not present

## 2020-08-04 DIAGNOSIS — L570 Actinic keratosis: Secondary | ICD-10-CM | POA: Diagnosis not present

## 2020-08-04 DIAGNOSIS — X32XXXD Exposure to sunlight, subsequent encounter: Secondary | ICD-10-CM | POA: Diagnosis not present

## 2020-08-08 NOTE — Progress Notes (Signed)
Assessment/Plan:    1.  Essential Tremor   -primidone helped at 50 mg bid but pt thought that it caused decreased HR.  This is not a known SE of primidone.  Refused vitals today.  Regardless, he doesn't want to be back on the meds due to SE.  Discussed that 2nd line meds would not be recommended for him, given age and potential anticholinergic SE of meds in the 2nd line category of meds.    -have discussed surgical interventions and did again today.  Pt states that tremor is not life altering.    2.  F/u prn  Subjective:   Jeffery Garrett was seen today in follow up for essential tremor.  My previous records were reviewed prior to todays visit. Pt states that he is no longer on the primidone - "it really helped and it surprised me.  I took it for 3 weeks."  Didn't make tremor go away but "it was a fairly dramatic change."   He states, however, that he noted that the primidone was like taking a beta blocker and it decreased his heart rate with exercise.   States that he stopped the primidone and his HR went back up but "I read everything on the primidone and I couldn't find its supposed to do that."  Pt brings in iphone that records his HR and he states that he has not been able to get back his VO2Max since being on the primidone, but thinks that the covid vaccine plays a role here as well.    Current prescribed movement disorder medications: -Primidone, 50 mg twice per day (started last visit)   PREVIOUS MEDICATIONS: primidone (pt felt that it caused him not to be able to get to max HR during exercise)  ALLERGIES:  No Known Allergies  CURRENT MEDICATIONS:  Outpatient Encounter Medications as of 08/10/2020  Medication Sig  . amLODipine (NORVASC) 5 MG tablet TAKE 1 TABLET(5 MG) BY MOUTH DAILY  . aspirin 81 MG tablet Take 81 mg by mouth daily.  . clobetasol cream (TEMOVATE) 0.05 % as needed.  . Coenzyme Q10 (COQ10 PO) Take 300 mg by mouth daily.   . flecainide (TAMBOCOR) 50 MG tablet  TAKE 1 TABLET BY MOUTH DAILY  . Magnesium Oxide 500 MG CAPS Take by mouth. occasionally  . Nutritional Supplements (PROSTATE ENZYME FORMULA PO) Take by mouth.  . Omega-3 Fatty Acids (FISH OIL) 1000 MG CAPS Take by mouth.  . primidone (MYSOLINE) 50 MG tablet Take 1 tablet (50 mg total) by mouth 2 (two) times daily.  . rosuvastatin (CRESTOR) 10 MG tablet Take 1 tablet (10 mg total) by mouth daily.  Marland Kitchen SYNTHROID 100 MCG tablet Take 100 mcg by mouth daily before breakfast.   . vitamin C (ASCORBIC ACID) 500 MG tablet Take 500 mg by mouth daily.   No facility-administered encounter medications on file as of 08/10/2020.     Objective:    PHYSICAL EXAMINATION:    VITALS:   Vitals:   08/10/20 0814  Weight: 146 lb (66.2 kg)  Height: 5\' 8"  (1.727 m)   Pt refuses vitals  GEN:  The patient appears stated age and is in NAD. HEENT:  Normocephalic, atraumatic.  The mucous membranes are moist. The superficial temporal arteries are without ropiness or tenderness. CV:  RRR Lungs:  CTAB Neck/HEME:  There are no carotid bruits bilaterally.  Neurological examination:  Orientation: The patient is alert and oriented x3. Cranial nerves: There is good facial symmetry. The  speech is fluent and clear. Soft palate rises symmetrically and there is no tongue deviation. Hearing is intact to conversational tone. Sensation: Sensation is intact to light touch throughout Motor: Strength is at least antigravity x4.  Movement examination: Tone: There is normal tone in the UE/LE Abnormal movements: there is postural tremor.   Coordination:  There is no decremation with RAM's  I have reviewed and interpreted the following labs independently   Chemistry      Component Value Date/Time   NA 136 03/08/2020 1549   K 4.1 03/08/2020 1549   CL 99 03/08/2020 1549   CO2 22 03/08/2020 1549   BUN 21 03/08/2020 1549   CREATININE 1.05 03/08/2020 1549      Component Value Date/Time   CALCIUM 9.7 03/08/2020 1549    ALKPHOS 31 (L) 09/04/2019 2006   AST 39 09/04/2019 2006   ALT 23 09/04/2019 2006   BILITOT 0.9 09/04/2019 2006        Chemistry      Component Value Date/Time   NA 136 03/08/2020 1549   K 4.1 03/08/2020 1549   CL 99 03/08/2020 1549   CO2 22 03/08/2020 1549   BUN 21 03/08/2020 1549   CREATININE 1.05 03/08/2020 1549      Component Value Date/Time   CALCIUM 9.7 03/08/2020 1549   ALKPHOS 31 (L) 09/04/2019 2006   AST 39 09/04/2019 2006   ALT 23 09/04/2019 2006   BILITOT 0.9 09/04/2019 2006         Total time spent on today's visit was 30 minutes, including both face-to-face time and nonface-to-face time.  Time included that spent on review of records (prior notes available to me/labs/imaging if pertinent), discussing treatment and goals, answering patient's questions and coordinating care.  Cc:  Burnard Bunting, MD

## 2020-08-10 ENCOUNTER — Ambulatory Visit (INDEPENDENT_AMBULATORY_CARE_PROVIDER_SITE_OTHER): Payer: Medicare Other | Admitting: Neurology

## 2020-08-10 ENCOUNTER — Encounter: Payer: Self-pay | Admitting: Neurology

## 2020-08-10 ENCOUNTER — Other Ambulatory Visit: Payer: Self-pay

## 2020-08-10 VITALS — Ht 68.0 in | Wt 146.0 lb

## 2020-08-10 DIAGNOSIS — G250 Essential tremor: Secondary | ICD-10-CM

## 2020-08-11 DIAGNOSIS — M1711 Unilateral primary osteoarthritis, right knee: Secondary | ICD-10-CM | POA: Diagnosis not present

## 2020-08-11 DIAGNOSIS — M25561 Pain in right knee: Secondary | ICD-10-CM | POA: Diagnosis not present

## 2020-08-11 DIAGNOSIS — M170 Bilateral primary osteoarthritis of knee: Secondary | ICD-10-CM | POA: Diagnosis not present

## 2020-08-16 DIAGNOSIS — Z23 Encounter for immunization: Secondary | ICD-10-CM | POA: Diagnosis not present

## 2020-08-21 ENCOUNTER — Other Ambulatory Visit: Payer: Self-pay | Admitting: Cardiology

## 2020-08-21 DIAGNOSIS — I10 Essential (primary) hypertension: Secondary | ICD-10-CM

## 2020-08-23 DIAGNOSIS — M25561 Pain in right knee: Secondary | ICD-10-CM | POA: Diagnosis not present

## 2020-08-23 DIAGNOSIS — M1711 Unilateral primary osteoarthritis, right knee: Secondary | ICD-10-CM | POA: Diagnosis not present

## 2020-10-06 DIAGNOSIS — E785 Hyperlipidemia, unspecified: Secondary | ICD-10-CM | POA: Diagnosis not present

## 2020-10-09 DIAGNOSIS — H2513 Age-related nuclear cataract, bilateral: Secondary | ICD-10-CM | POA: Diagnosis not present

## 2020-10-11 DIAGNOSIS — E785 Hyperlipidemia, unspecified: Secondary | ICD-10-CM | POA: Diagnosis not present

## 2020-10-11 DIAGNOSIS — R0600 Dyspnea, unspecified: Secondary | ICD-10-CM | POA: Diagnosis not present

## 2020-10-11 DIAGNOSIS — N1831 Chronic kidney disease, stage 3a: Secondary | ICD-10-CM | POA: Diagnosis not present

## 2020-10-11 DIAGNOSIS — E039 Hypothyroidism, unspecified: Secondary | ICD-10-CM | POA: Diagnosis not present

## 2020-10-11 DIAGNOSIS — I129 Hypertensive chronic kidney disease with stage 1 through stage 4 chronic kidney disease, or unspecified chronic kidney disease: Secondary | ICD-10-CM | POA: Diagnosis not present

## 2020-10-17 DIAGNOSIS — C44729 Squamous cell carcinoma of skin of left lower limb, including hip: Secondary | ICD-10-CM | POA: Diagnosis not present

## 2020-10-25 ENCOUNTER — Ambulatory Visit: Payer: Medicare Other | Admitting: Cardiology

## 2020-11-08 ENCOUNTER — Other Ambulatory Visit: Payer: Self-pay

## 2020-11-08 ENCOUNTER — Encounter: Payer: Self-pay | Admitting: Cardiology

## 2020-11-08 ENCOUNTER — Ambulatory Visit: Payer: Medicare Other | Admitting: Cardiology

## 2020-11-08 VITALS — BP 140/80 | HR 80 | Resp 16 | Ht 68.0 in | Wt 146.0 lb

## 2020-11-08 DIAGNOSIS — R0609 Other forms of dyspnea: Secondary | ICD-10-CM

## 2020-11-08 DIAGNOSIS — R002 Palpitations: Secondary | ICD-10-CM

## 2020-11-08 DIAGNOSIS — Z9889 Other specified postprocedural states: Secondary | ICD-10-CM | POA: Diagnosis not present

## 2020-11-08 DIAGNOSIS — I10 Essential (primary) hypertension: Secondary | ICD-10-CM | POA: Diagnosis not present

## 2020-11-08 DIAGNOSIS — Z8679 Personal history of other diseases of the circulatory system: Secondary | ICD-10-CM | POA: Diagnosis not present

## 2020-11-08 DIAGNOSIS — I351 Nonrheumatic aortic (valve) insufficiency: Secondary | ICD-10-CM

## 2020-11-08 DIAGNOSIS — R0600 Dyspnea, unspecified: Secondary | ICD-10-CM

## 2020-11-08 DIAGNOSIS — G252 Other specified forms of tremor: Secondary | ICD-10-CM | POA: Diagnosis not present

## 2020-11-08 DIAGNOSIS — E7800 Pure hypercholesterolemia, unspecified: Secondary | ICD-10-CM | POA: Diagnosis not present

## 2020-11-08 NOTE — Progress Notes (Signed)
Primary Physician/Referring:  Burnard Bunting, MD  Patient ID: Jeffery Garrett, male    DOB: 1941-06-02, 79 y.o.   MRN: [id number]  Chief Complaint  Patient presents with  . Hypertension  . Atrial Fibrillation  . Follow-up    1 year   HPI:    Jeffery Garrett  is a 79 y.o.   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 He continues to lead an active lifestyle, bikes 15-20 miles 2-3 times a week and also hikes mountains frequently.    Since receiving COVID-19 vaccine in January 2021, states that he has been having worsening exacerbation of his chronic skin rash, worsening palpitations in the form of skipped beats, fatigue, abdominal bloating, and worsening tremors. Patient has noticed markedly reduced VO2 max starting 03/18/2020, brings in his data from his iPhone when he exercises.  There is clearly been a marked drop in his VO2 max and trend also suggest that he has never regained his normal VO2 max which is around 35-40.  Otherwise from cardiac standpoint is remained stable he has not had any recent palpitations, no chest pain, except for mild dyspnea on exertion he has no other specific symptoms.  Past Medical History:  Diagnosis Date  . A-fib ([place])   . Hyperlipidemia   . Hypertension    Past Surgical History:  Procedure Laterality Date  . ABLATION  2007  . ATRIAL FIBRILLATION ABLATION    . neck fusion  1995   Social History   Tobacco Use  . Smoking status: Never Smoker  . Smokeless tobacco: Never Used  Substance Use Topics  . Alcohol use: Yes    Comment: rare   Marital status: Married   ROS  Review of Systems  Constitutional: Negative for malaise/fatigue.  Cardiovascular: Positive for dyspnea on exertion. Negative for leg swelling and palpitations.  Gastrointestinal: Negative for  melena and nausea.  Neurological: Positive for tremors.   Objective  Blood pressure 140/80, pulse 80, resp. rate 16, height $RemoveBe'5\' 8"'ZhbVXRmaA$  (1.727 m), weight 146 lb (66.2 kg), SpO2 96 %. Body mass index is 22.2 kg/m.   Vitals with BMI 11/08/2020 08/10/2020 03/09/2020  Height $Remov'5\' 8"'QPJwsb$  $Remove'5\' 8"'BpsvUdA$  $RemoveB'5\' 8"'YzzXrbnn$   Weight 146 lbs 146 lbs 144 lbs  BMI 22.2 88.4 16.6  Systolic 063 - 016  Diastolic 80 - 84  Pulse 80 - 75    Physical Exam Constitutional:      General: He is not in acute distress.    Appearance: He is well-developed.  Cardiovascular:     Rate and Rhythm: Normal rate and regular rhythm.     Pulses: Intact distal pulses.     Heart sounds: S1 normal and S2 normal. Murmur heard.  Midsystolic murmur is present with a grade of 2/6 at the upper right sternal border.  No gallop.   Pulmonary:     Effort: Pulmonary effort is normal.     Breath sounds: Normal breath sounds.  Abdominal:     General: Bowel sounds are normal.     Palpations: Abdomen is soft.    Radiology: No results found.  Laboratory examination:   Urinalysis    Component Value Date/Time   COLORURINE YELLOW 09/04/2019 2000   APPEARANCEUR Clear 01/19/2020 1407   LABSPEC <1.005 (L) 09/04/2019 2000   PHURINE 7.0 09/04/2019 2000   GLUCOSEU Negative 01/19/2020  [place] 09/04/2019 2000   BILIRUBINUR Negative 01/19/2020 [place] 09/04/2019 2000   PROTEINUR Trace 01/19/2020 [place] 09/04/2019 2000   NITRITE Negative 01/19/2020 1407   NITRITE NEGATIVE 09/04/2019 2000   LEUKOCYTESUR Negative 01/19/2020 1407   LEUKOCYTESUR NEGATIVE 09/04/2019 2000    CMP Latest Ref Rng & Units 03/08/2020 02/01/2020 10/25/2019  Glucose 65 - 99 mg/dL 109(H) 78 124(H)  BUN 8 - 27 mg/dL $Remove'21 20 12  'fDIlXwq$ Creatinine 0.76 - 1.27 mg/dL 1.05 1.06 1.17  Sodium 134 - 144 mmol/L 136 141 136  Potassium 3.5 - 5.2 mmol/L 4.1 5.1 4.4  Chloride 96 - 106 mmol/L 99 105 96  CO2 20 - 29 mmol/L $RemoveB'22 24 23  'ustVyHcl$ Calcium 8.6 - 10.2 mg/dL 9.7  9.4 8.9  Total Protein 6.5 - 8.1 g/dL - - -  Total Bilirubin 0.3 - 1.2 mg/dL - - -  Alkaline Phos 38 - 126 U/L - - -  AST 15 - 41 U/L - - -  ALT 0 - 44 U/L - - -   CBC Latest Ref Rng & Units 09/04/2019  WBC 4.0 - 10.5 K/uL 8.1  Hemoglobin 13.0 - 17.0 g/dL 12.3(L)  Hematocrit 39 - 52 % 35.5(L)  Platelets 150 - 400 K/uL 177   Lipid Panel  No results found for: CHOL, TRIG, HDL, CHOLHDL, VLDL, LDLCALC, LDLDIRECT, LABVLDL   External Labs 04/05/2019:   Serum glucose 97 mg, BUN 20, creatinine 1.1, sodium 129, potassium 4.5, CMP otherwise normal, CBC normal.   Total cholesterol 153, triglycerides 90, HDL 50, LDL 82, nondistended cholesterol 100.  TSH, free T4 and PSA normal.  Medications   Current Outpatient Medications on File Prior to Visit  Medication Sig Dispense Refill  . amLODipine (NORVASC) 5 MG tablet TAKE 1 TABLET(5 MG) BY MOUTH DAILY 30 tablet 2  . aspirin 81 MG tablet Take 81 mg by mouth daily.    . clobetasol cream (TEMOVATE) 0.05 % as needed.    . flecainide (TAMBOCOR) 50 MG tablet TAKE 1 TABLET BY MOUTH DAILY 90 tablet 3  . Magnesium Oxide 500 MG CAPS Take by mouth. occasionally    . Nutritional Supplements (PROSTATE ENZYME FORMULA PO) Take by mouth.    . Omega-3 Fatty Acids (FISH OIL) 1000 MG CAPS Take by mouth.    . rosuvastatin (CRESTOR) 10 MG tablet Take 1 tablet (10 mg total) by mouth daily. 90 tablet 2  . SYNTHROID 100 MCG tablet Take 100 mcg by mouth daily before breakfast.     . vitamin C (ASCORBIC ACID) 500 MG tablet Take 500 mg by mouth daily.     No current facility-administered medications on file prior to visit.    Cardiac Studies:   Echocardiogram 11/14/2015:   Left ventricle cavity is normal in size. Normal global wall motion. Normal diastolic filling pattern. Calculated EF 64%. Left atrial cavity is mildly dilated. Trace mitral regurgitation. Trace tricuspid regurgitation. No evidence of pulmonary hypertension. Essentially normal  echocardiogram.  EKG:   EKG 11/08/2020: Sinus rhythm with first-degree AV block at rate of 89 bpm, left atrial enlargement, left axis deviation, left anterior fascicular block.  Incomplete right bundle branch block.  Poor R wave progression, probably normal variant.  Normal QT interval. No significant change from 09/11/2019.  Assessment      ICD-10-CM   1. Essential hypertension  I10 EKG 12-Lead    CMP14+EGFR    Magnesium    PCV ECHOCARDIOGRAM COMPLETE  2.  S/P ablation of atrial fibrillation  Z98.890    Z86.79   3. Palpitations  R00.2   4. Hypercholesteremia  E78.00   5. Coarse tremors  G25.2   6. Aortic ejection murmur  I35.1 PCV ECHOCARDIOGRAM COMPLETE  7. Dyspnea on exertion  R06.00 Cardiopulmonary exercise test    Ambulatory referral to Pulmonology   No orders of the defined types were placed in this encounter.   Medications Discontinued During This Encounter  Medication Reason  . Coenzyme Q10 (COQ10 PO) Patient Preference  . primidone (MYSOLINE) 50 MG tablet Patient Preference    Recommendations:   Jeffery Garrett  is a 79 y.o. 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 He continues to lead an active lifestyle, bikes 15-20 miles 2-3 times a week and also hikes mountains frequently. After he received Covid vaccine, he is developed severe immunologic reaction including worsening skin rash, worsening tremors, worsening abdominal bloating, weight loss of 6 to 8 pounds, frequent palpitations.   Patient has noticed markedly reduced VO2 max starting 03/18/2020, brings in his data from his iPhone when he exercises.  There is clearly been a marked drop in his VO2 max and trend also suggest that he has never regained his normal VO2 max which is around 35-40.  Due to dyspnea, I will obtain an  echocardiogram, I will also perform a cardiopulmonary stress test and also refer him to be evaluated by pulmonary medicine.  After he received second dose of booster for COVID-19 vaccine, Pfizer, he had developed severe abdominal discomfort, weight loss, generalized ill feeling and since then he has noticed decreased exercise tolerance.  From cardiac standpoint his examination is unchanged.  EKG also reveals normal sinus rhythm.  Surprisingly he used to be bradycardic previously, his heart rate now is normal.  Unless the test revealed significant abnormality, I will see him back in 6 months.  I will certainly follow-up on the CPX and also pulmonary consultation.  After I placed the orders, I did realize Dr. Joya Salm has already made a referral to pulmonary medicine.  Blood pressures well controlled, although systolic is slightly high, he brings home recordings which are under excellent control.  With regard to hyperlipidemia, being managed by PCP, he had come off of statins to see whether his skin rash would improve however as the lipids have elevated, he restarted back to Crestor.  I have evaluated his records and his data from phone and spent 40 minutes discussing his cardio-pulmonary issues.             Adrian Prows, MD, [facility] 11/08/2020, 4:01 PM Office: [phone] Pager: [phone]   CC: Lenice Llamas, MD ([place])

## 2020-11-09 LAB — CMP14+EGFR
ALT: 27 IU/L (ref 0–44)
AST: 29 IU/L (ref 0–40)
Albumin/Globulin Ratio: 1.8 (ref 1.2–2.2)
Albumin: 4.8 g/dL — ABNORMAL HIGH (ref 3.7–4.7)
Alkaline Phosphatase: 43 IU/L — ABNORMAL LOW (ref 44–121)
BUN/Creatinine Ratio: 14 (ref 10–24)
BUN: 15 mg/dL (ref 8–27)
Bilirubin Total: 0.5 mg/dL (ref 0.0–1.2)
CO2: 25 mmol/L (ref 20–29)
Calcium: 9.2 mg/dL (ref 8.6–10.2)
Chloride: 98 mmol/L (ref 96–106)
Creatinine, Ser: 1.09 mg/dL (ref 0.76–1.27)
GFR calc Af Amer: 74 mL/min/{1.73_m2} (ref >59)
GFR calc non Af Amer: 64 mL/min/{1.73_m2} (ref >59)
Globulin, Total: 2.7 g/dL (ref 1.5–4.5)
Glucose: 97 mg/dL (ref 65–99)
Potassium: 4.1 mmol/L (ref 3.5–5.2)
Sodium: 137 mmol/L (ref 134–144)
Total Protein: 7.5 g/dL (ref 6.0–8.5)

## 2020-11-09 LAB — MAGNESIUM: Magnesium: 2.2 mg/dL (ref 1.6–2.3)

## 2020-11-09 NOTE — Progress Notes (Signed)
Normal CMP and Magnesium levels.

## 2020-11-19 ENCOUNTER — Other Ambulatory Visit: Payer: Self-pay | Admitting: Cardiology

## 2020-11-19 DIAGNOSIS — I10 Essential (primary) hypertension: Secondary | ICD-10-CM

## 2020-11-20 ENCOUNTER — Encounter: Payer: Self-pay | Admitting: Internal Medicine

## 2020-11-20 ENCOUNTER — Other Ambulatory Visit: Payer: Self-pay | Admitting: Cardiology

## 2020-11-20 ENCOUNTER — Ambulatory Visit (INDEPENDENT_AMBULATORY_CARE_PROVIDER_SITE_OTHER): Payer: Medicare Other | Admitting: Internal Medicine

## 2020-11-20 ENCOUNTER — Other Ambulatory Visit: Payer: Self-pay

## 2020-11-20 VITALS — HR 93 | Temp 97.3°F | Ht 68.0 in | Wt 147.0 lb

## 2020-11-20 DIAGNOSIS — R0602 Shortness of breath: Secondary | ICD-10-CM

## 2020-11-20 DIAGNOSIS — I10 Essential (primary) hypertension: Secondary | ICD-10-CM

## 2020-11-20 DIAGNOSIS — R6889 Other general symptoms and signs: Secondary | ICD-10-CM | POA: Diagnosis not present

## 2020-11-20 NOTE — Progress Notes (Signed)
Jeffery Garrett    [id number]    03-06-1941  Primary Care Physician:Aronson, Delfino Lovett, MD  Referring Physician: Burnard Bunting, MD [address] [place],  [place] [address] Reason for Consultation: shortness of breath Date of Consultation: 11/20/2020  Chief complaint:   Chief Complaint  Patient presents with  . Consult    Vaccines concerns      HPI: Jeffery Garrett is a 79 y.o. 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  He had a significant immune reaction following the vaccine with bloating, palpitations, weight loss, rashes and tremors.  He notes his VO2 max has dropped. He has followed up with Dr. Einar Gip who has ordered an echocardiogram and CPET. His baseline VO2 max is 35 and he has been less than 28 for the last several months. He notices a significant impairment once he reaches anaerobic threshold. No chest pain or palpitations, no cough or wheezing. He doesn't feel like it's his heart, and instead feels like it's his lungs. He had coughing  Immediately following vaccine administration and this has since resolved. He is very concerned about his dropping VO2 max int he setting of being a conditioned and serious athlete and is very motivated to find out why he cannot exercise the same way after his vaccine administration.  No childhood respiratory disease.   Social history:  Occupation: retired from [employer] Smoking history: never smoker.   Social History   Occupational History  . Not on file  Tobacco Use  . Smoking status: Never Smoker  . Smokeless tobacco: Never Used  Vaping Use  . Vaping Use: Never used  Substance and Sexual Activity  . Alcohol use: Yes    Comment: rare  . Drug use: No  . Sexual activity: Not Currently    Relevant family history:  Family History  Adopted: Yes  Problem Relation Age of Onset  . Hyperlipidemia  Mother   . Heart attack Mother     Past Medical History:  Diagnosis Date  . A-fib ([place])   . Hyperlipidemia   . Hypertension     Past Surgical History:  Procedure Laterality Date  . ABLATION  2007  . ATRIAL FIBRILLATION ABLATION    . neck fusion  1995     Physical Exam: Pulse 93, temperature (!) 97.3 F (36.3 C), height 5\' 8"  (1.727 m), weight 147 lb (66.7 kg), SpO2 99 %. patient refused BP.  Gen:      No acute distress, appears younger than stated age. ENT:  no nasal polyps, mucus membranes moist Lungs:    No increased respiratory effort, symmetric chest wall excursion, clear to auscultation bilaterally, no wheezes or crackles CV:         Regular rate and rhythm; no murmurs, rubs, or gallops.  No pedal edema Abd:      + bowel sounds; soft, non-tender; no distension MSK: no acute synovitis of DIP or PIP joints, no mechanics hands.  Skin:      Warm and dry; no rashes Neuro: normal speech, no focal facial asymmetry, +tremor Psych: alert and oriented x3, normal mood and affect   Data Reviewed/Medical Decision Making:  Independent interpretation of tests: Imaging: . Review of patient's CT coronary 2008 images revealed no significant findings on lung windows. The patient's images have been independently reviewed by me.  CT head personally reviewed from October  2020. No significant findings, mild small vessel white matter changes  PFTs: None on file  Labs:  Lab Results  Component Value Date   WBC 8.1 09/04/2019   HGB 12.3 (L) 09/04/2019   HCT 35.5 (L) 09/04/2019   MCV 93.2 09/04/2019   PLT 177 09/04/2019   Lab Results  Component Value Date   NA 137 11/08/2020   K 4.1 11/08/2020   CL 98 11/08/2020   CO2 25 11/08/2020     Immunization status:  Immunization History  Administered Date(s) Administered  . Influenza Whole 10/11/2020  . Influenza, High Dose Seasonal PF 09/03/2018, 08/25/2019  . PFIZER SARS-COV-2 Vaccination 12/23/2019, 01/11/2020    . I reviewed  prior external note(s) from Cardiology, primary care . I reviewed the result(s) of the labs and imaging as noted above.  . I have ordered PFTs  Discussion of management or test interpretation with another colleague - Dr. Einar Gip  Assessment:  Decreased exercise tolerance following COVID 19 immunization.   Plan/Recommendations: Unclear etiology for his decreased exercise tolerance.  I discussed obtaining a full set of PFTs, but he is getting his CPET done with spirometry in January Follow up results of CPET. I will see him back after this. He has reportedly had a normal chest xray with PCP. On exam today no evidence of interstitial lung disease or smoking related lung disease or obstructive airways disease based on history.   We discussed disease management and progression at length today.   I spent 60 minutes in the care of this patient today including pre-charting, chart review, review of results, face-to-face care, coordination of care and communication with consultants etc.).  Return to Care: Return in about 4 weeks (around 12/18/2020).  Lenice Llamas, MD Pulmonary and [place]  CC: Burnard Bunting, MD

## 2020-11-20 NOTE — Patient Instructions (Signed)
The patient should have follow up scheduled with myself in 1 months.   Prior to next visit patient should have: Full set of PFTs  

## 2020-11-21 ENCOUNTER — Other Ambulatory Visit: Payer: Medicare Other

## 2020-12-03 ENCOUNTER — Other Ambulatory Visit: Payer: Self-pay | Admitting: Cardiology

## 2020-12-06 DIAGNOSIS — M1711 Unilateral primary osteoarthritis, right knee: Secondary | ICD-10-CM | POA: Diagnosis not present

## 2020-12-06 DIAGNOSIS — M170 Bilateral primary osteoarthritis of knee: Secondary | ICD-10-CM | POA: Diagnosis not present

## 2020-12-06 DIAGNOSIS — M25561 Pain in right knee: Secondary | ICD-10-CM | POA: Diagnosis not present

## 2020-12-07 ENCOUNTER — Ambulatory Visit: Payer: Medicare Other

## 2020-12-07 ENCOUNTER — Telehealth: Payer: Self-pay | Admitting: Cardiology

## 2020-12-07 ENCOUNTER — Other Ambulatory Visit: Payer: Self-pay

## 2020-12-07 DIAGNOSIS — I351 Nonrheumatic aortic (valve) insufficiency: Secondary | ICD-10-CM

## 2020-12-07 DIAGNOSIS — I10 Essential (primary) hypertension: Secondary | ICD-10-CM | POA: Diagnosis not present

## 2020-12-07 NOTE — Telephone Encounter (Signed)
Error

## 2020-12-14 ENCOUNTER — Telehealth (HOSPITAL_COMMUNITY): Payer: Self-pay | Admitting: *Deleted

## 2020-12-14 ENCOUNTER — Telehealth (HOSPITAL_COMMUNITY): Payer: Self-pay | Admitting: Vascular Surgery

## 2020-12-14 NOTE — Telephone Encounter (Signed)
Called pt several times , pt would answer, but would not say anything, I could hear wife in back ground , but they would not respond to me, called referring provider office to reach out to pt to let  Pt know 12/18/20 appt will be canceled and to have pt call back to reschedule CPX

## 2020-12-14 NOTE — Telephone Encounter (Signed)
Contacted patient to cancel/reschedule CPX for 12/18/20 due to weather preparation. Patient unavailable, left voicemail for patient to return call at [phone] to reschedule CPX.  Will continue to try to schedule patient.   Landis Martins, MS, ACSM-RCEP Clinical Exercise Physiologist

## 2020-12-18 ENCOUNTER — Encounter (HOSPITAL_COMMUNITY): Payer: Medicare Other

## 2020-12-19 DIAGNOSIS — Z85828 Personal history of other malignant neoplasm of skin: Secondary | ICD-10-CM | POA: Diagnosis not present

## 2020-12-19 DIAGNOSIS — L308 Other specified dermatitis: Secondary | ICD-10-CM | POA: Diagnosis not present

## 2020-12-19 DIAGNOSIS — C44729 Squamous cell carcinoma of skin of left lower limb, including hip: Secondary | ICD-10-CM | POA: Diagnosis not present

## 2020-12-19 DIAGNOSIS — Z08 Encounter for follow-up examination after completed treatment for malignant neoplasm: Secondary | ICD-10-CM | POA: Diagnosis not present

## 2020-12-21 DIAGNOSIS — M25561 Pain in right knee: Secondary | ICD-10-CM | POA: Diagnosis not present

## 2020-12-21 DIAGNOSIS — M1711 Unilateral primary osteoarthritis, right knee: Secondary | ICD-10-CM | POA: Diagnosis not present

## 2020-12-25 ENCOUNTER — Ambulatory Visit: Payer: Medicare Other | Admitting: Internal Medicine

## 2021-01-02 ENCOUNTER — Encounter (HOSPITAL_COMMUNITY): Payer: Medicare Other

## 2021-01-03 DIAGNOSIS — M1711 Unilateral primary osteoarthritis, right knee: Secondary | ICD-10-CM | POA: Diagnosis not present

## 2021-01-03 DIAGNOSIS — M25561 Pain in right knee: Secondary | ICD-10-CM | POA: Diagnosis not present

## 2021-01-16 ENCOUNTER — Ambulatory Visit: Payer: Medicare Other | Admitting: Internal Medicine

## 2021-01-23 DIAGNOSIS — C44722 Squamous cell carcinoma of skin of right lower limb, including hip: Secondary | ICD-10-CM | POA: Diagnosis not present

## 2021-01-30 ENCOUNTER — Encounter (HOSPITAL_COMMUNITY): Payer: Medicare Other

## 2021-02-28 ENCOUNTER — Other Ambulatory Visit: Payer: Self-pay | Admitting: Cardiology

## 2021-03-01 ENCOUNTER — Other Ambulatory Visit: Payer: Self-pay | Admitting: Cardiology

## 2021-03-01 DIAGNOSIS — I10 Essential (primary) hypertension: Secondary | ICD-10-CM

## 2021-03-06 DIAGNOSIS — Z85828 Personal history of other malignant neoplasm of skin: Secondary | ICD-10-CM | POA: Diagnosis not present

## 2021-03-06 DIAGNOSIS — Z08 Encounter for follow-up examination after completed treatment for malignant neoplasm: Secondary | ICD-10-CM | POA: Diagnosis not present

## 2021-04-11 DIAGNOSIS — D0471 Carcinoma in situ of skin of right lower limb, including hip: Secondary | ICD-10-CM | POA: Diagnosis not present

## 2021-05-02 DIAGNOSIS — E785 Hyperlipidemia, unspecified: Secondary | ICD-10-CM | POA: Diagnosis not present

## 2021-05-02 DIAGNOSIS — Z125 Encounter for screening for malignant neoplasm of prostate: Secondary | ICD-10-CM | POA: Diagnosis not present

## 2021-05-02 DIAGNOSIS — Z0000 Encounter for general adult medical examination without abnormal findings: Secondary | ICD-10-CM | POA: Diagnosis not present

## 2021-05-02 DIAGNOSIS — E039 Hypothyroidism, unspecified: Secondary | ICD-10-CM | POA: Diagnosis not present

## 2021-05-09 DIAGNOSIS — Z1331 Encounter for screening for depression: Secondary | ICD-10-CM | POA: Diagnosis not present

## 2021-05-09 DIAGNOSIS — I129 Hypertensive chronic kidney disease with stage 1 through stage 4 chronic kidney disease, or unspecified chronic kidney disease: Secondary | ICD-10-CM | POA: Diagnosis not present

## 2021-05-09 DIAGNOSIS — N1831 Chronic kidney disease, stage 3a: Secondary | ICD-10-CM | POA: Diagnosis not present

## 2021-05-09 DIAGNOSIS — Z1339 Encounter for screening examination for other mental health and behavioral disorders: Secondary | ICD-10-CM | POA: Diagnosis not present

## 2021-05-09 DIAGNOSIS — R972 Elevated prostate specific antigen [PSA]: Secondary | ICD-10-CM | POA: Diagnosis not present

## 2021-05-09 DIAGNOSIS — Z0000 Encounter for general adult medical examination without abnormal findings: Secondary | ICD-10-CM | POA: Diagnosis not present

## 2021-05-09 DIAGNOSIS — R82998 Other abnormal findings in urine: Secondary | ICD-10-CM | POA: Diagnosis not present

## 2021-05-09 DIAGNOSIS — E785 Hyperlipidemia, unspecified: Secondary | ICD-10-CM | POA: Diagnosis not present

## 2021-05-09 DIAGNOSIS — E039 Hypothyroidism, unspecified: Secondary | ICD-10-CM | POA: Diagnosis not present

## 2021-05-11 ENCOUNTER — Encounter: Payer: Self-pay | Admitting: Cardiology

## 2021-05-11 ENCOUNTER — Other Ambulatory Visit: Payer: Self-pay

## 2021-05-11 ENCOUNTER — Ambulatory Visit: Payer: Medicare Other | Admitting: Cardiology

## 2021-05-11 VITALS — BP 164/83 | HR 62 | Temp 98.3°F | Resp 16 | Ht 68.0 in | Wt 148.0 lb

## 2021-05-11 DIAGNOSIS — I10 Essential (primary) hypertension: Secondary | ICD-10-CM | POA: Diagnosis not present

## 2021-05-11 DIAGNOSIS — E7800 Pure hypercholesterolemia, unspecified: Secondary | ICD-10-CM | POA: Diagnosis not present

## 2021-05-11 DIAGNOSIS — Z8679 Personal history of other diseases of the circulatory system: Secondary | ICD-10-CM | POA: Diagnosis not present

## 2021-05-11 DIAGNOSIS — Z9889 Other specified postprocedural states: Secondary | ICD-10-CM

## 2021-05-11 DIAGNOSIS — R002 Palpitations: Secondary | ICD-10-CM | POA: Diagnosis not present

## 2021-05-11 NOTE — Progress Notes (Signed)
Primary Physician/Referring:  Burnard Bunting, MD  Patient ID: Jeffery Garrett, male    DOB: 1941/01/03, 80 y.o.   MRN: [id number]  Chief Complaint  Patient presents with   Atrial Fibrillation   Hypertension   Follow-up    6 months   HPI:    ARIAN MURLEY  is a 80 y.o.   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 He continues to lead an active lifestyle, bikes 15-20 miles 2-3 times a week and also hikes mountains frequently.    Since receiving COVID-19 vaccine in January 2021, states that he has been having worsening exacerbation of his chronic skin rash, worsening palpitations in the form of skipped beats, fatigue, abdominal bloating, and worsening tremors. Patient has noticed markedly reduced VO2 max starting 03/18/2020, data from his iPhone when he exercises shows a marked drop in his VO2 max and trend also suggest that he has never regained his normal VO2 max which is around 35-40.  Otherwise from cardiac standpoint is remained stable he has not had any recent palpitations, no chest pain, except for mild dyspnea on exertion he has no other specific symptoms.  Past Medical History:  Diagnosis Date   A-fib ([place])    Hyperlipidemia    Hypertension    Past Surgical History:  Procedure Laterality Date   ABLATION  2007   ATRIAL FIBRILLATION ABLATION     neck fusion  1995   Social History   Tobacco Use   Smoking status: Never   Smokeless tobacco: Never  Substance Use Topics   Alcohol use: Yes    Comment: rare   Marital status: Married   ROS  Review of Systems  Constitutional: Negative for malaise/fatigue.  Cardiovascular:  Positive for dyspnea on exertion. Negative for leg swelling and palpitations.  Gastrointestinal:  Negative for melena and nausea.  Neurological:  Positive for  tremors.  Objective  Blood pressure (!) 164/83, pulse 62, temperature 98.3 F (36.8 C), temperature source Temporal, resp. rate 16, height 5\' 8"  (1.727 m), weight 148 lb (67.1 kg), SpO2 99 %. Body mass index is 22.5 kg/m.   Vitals with BMI 05/11/2021 05/11/2021 11/20/2020  Height - 5\' 8"  5\' 8"   Weight - 148 lbs 147 lbs  BMI - 23.34 35.68  Systolic 616 837 (No Data)  Diastolic 83 83 (No Data)  Pulse 62 98 93    Physical Exam Constitutional:      General: He is not in acute distress.    Appearance: He is well-developed.  Cardiovascular:     Rate and Rhythm: Regular rhythm. Bradycardia present.     Pulses: Normal pulses and intact distal pulses.     Heart sounds: S1 normal and S2 normal. No murmur heard.   No gallop.  Pulmonary:     Effort: Pulmonary effort is normal.     Breath sounds: Normal breath sounds.  Abdominal:     General: Bowel sounds are normal.     Palpations: Abdomen is soft.   Radiology: No results found.  Laboratory examination:   Urinalysis    Component Value Date/Time   COLORURINE YELLOW 09/04/2019 2000   APPEARANCEUR Clear 01/19/2020 1407   LABSPEC <1.005 (L) 09/04/2019 2000   PHURINE 7.0 09/04/2019 2000   GLUCOSEU Negative 01/19/2020 1407   HGBUR NEGATIVE 09/04/2019 2000   BILIRUBINUR Negative 01/19/2020  [place] 09/04/2019 [place] 01/19/2020 [place] 09/04/2019 2000   NITRITE Negative 01/19/2020 1407   NITRITE NEGATIVE 09/04/2019 2000   LEUKOCYTESUR Negative 01/19/2020 1407   LEUKOCYTESUR NEGATIVE 09/04/2019 2000    CMP Latest Ref Rng & Units 11/08/2020 03/08/2020 02/01/2020  Glucose 65 - 99 mg/dL 97 109(H) 78  BUN 8 - 27 mg/dL 15 21 20   Creatinine 0.76 - 1.27 mg/dL 1.09 1.05 1.06  Sodium 134 - 144 mmol/L 137 136 141  Potassium 3.5 - 5.2 mmol/L 4.1 4.1 5.1  Chloride 96 - 106 mmol/L 98 99 105  CO2 20 - 29 mmol/L 25 22 24   Calcium 8.6 - 10.2 mg/dL 9.2 9.7 9.4  Total Protein 6.0 - 8.5 g/dL 7.5 - -   Total Bilirubin 0.0 - 1.2 mg/dL 0.5 - -  Alkaline Phos 44 - 121 IU/L 43(L) - -  AST 0 - 40 IU/L 29 - -  ALT 0 - 44 IU/L 27 - -   CBC Latest Ref Rng & Units 09/04/2019  WBC 4.0 - 10.5 K/uL 8.1  Hemoglobin 13.0 - 17.0 g/dL 12.3(L)  Hematocrit 39.0 - 52.0 % 35.5(L)  Platelets 150 - 400 K/uL 177   Lipid Panel  No results found for: CHOL, TRIG, HDL, CHOLHDL, VLDL, LDLCALC, LDLDIRECT, LABVLDL   External Labs 04/05/2019:   Cholesterol, total 244.000 m 10/06/2020 HDL 55.000 mg 10/06/2020 LDL 173.000 m 10/06/2020 Triglycerides 78.000 mg 10/06/2020  Hemoglobin 12.900 g/d 04/10/2020  TSH 1.460 04/10/2020  Serum glucose 97 mg, BUN 20, creatinine 1.1, sodium 129, potassium 4.5, CMP otherwise normal, CBC normal.   Total cholesterol 153, triglycerides 90, HDL 50, LDL 82, nondistended cholesterol 100.  TSH, free T4 and PSA normal.  Medications   Current Outpatient Medications on File Prior to Visit  Medication Sig Dispense Refill   amLODipine (NORVASC) 5 MG tablet TAKE 1 TABLET(5 MG) BY MOUTH DAILY 30 tablet 6   flecainide (TAMBOCOR) 50 MG tablet TAKE 1 TABLET BY MOUTH DAILY 90 tablet 3   Magnesium Oxide 500 MG CAPS Take by mouth. occasionally     Omega-3 Fatty Acids (FISH OIL) 1000 MG CAPS Take by mouth.     rosuvastatin (CRESTOR) 10 MG tablet TAKE 1 TABLET(10 MG) BY MOUTH DAILY 90 tablet 2   SYNTHROID 100 MCG tablet Take 100 mcg by mouth daily before breakfast.      vitamin C (ASCORBIC ACID) 500 MG tablet Take 500 mg by mouth daily.     No current facility-administered medications on file prior to visit.    Cardiac Studies:   Echocardiogram 12/07/2020: Left ventricle cavity is normal in size and wall thickness. Normal global wall motion. Normal LV systolic function with EF 54%. Normal diastolic filling pattern. Mild tricuspid regurgitation. No evidence of pulmonary hypertension. No significant change compared to previous study in 2016.  EKG:  EKG 05/11/2021: Sinus bradycardia with  first-degree block at rate of 53 bpm, left atrial abnormality, otherwise normal EKG. no significant change from 11/08/2020.  No significant change from 11/08/2020.  Assessment      ICD-10-CM   1. Essential hypertension  I10     2. White coat syndrome with diagnosis of hypertension  I10     3. Palpitations  R00.2     4. S/P ablation of atrial fibrillation  Z98.890 EKG 12-Lead   Z86.79     5. Hypercholesteremia  E78.00      No orders of the defined types were  placed in this encounter.   Medications Discontinued During This Encounter  Medication Reason   amLODipine (NORVASC) 5 MG tablet Error   aspirin 81 MG tablet Error   clobetasol cream (TEMOVATE) 0.05 % Error   Nutritional Supplements (PROSTATE ENZYME FORMULA PO) Error    Recommendations:   GABLE ODONOHUE  is a 80 y.o. 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 He continues to lead an active lifestyle, bikes 15-20 miles 2-3 times a week and also hikes mountains frequently. After he received Covid vaccine, he is developed severe immunologic reaction including worsening skin rash, worsening tremors, worsening abdominal bloating, weight loss of 6 to 8 pounds, frequent palpitations.   Patient has noticed markedly reduced VO2 max starting 03/18/2020, brings in his data from his iPhone when he exercises.  There is clearly been a marked drop in his VO2 max and trend also suggest that he has never regained his normal VO2 max which is around 35-40.  Patient had developed sudden drop in his metabolic equivalent as he monitors this very closely since COVID.  He did not want any further evaluation by pulmonary critical care, he also did not go through cardiopulmonary stress test.  He is still pushing himself hard to get back to baseline and continues to monitor  his V O2 max closely on his app.  No changes in the medications were done today.  He continues to have elevated blood pressure but home recordings have been perfectly normal.  External labs reviewed, lipids are under excellent control as well.  No changes in the medications were done today, I will see him back in a year or sooner if problems.  Palpitation symptoms are remained stable.    Adrian Prows, MD, [facility] 05/11/2021, 9:32 AM Office: [phone] Pager: [phone]   CC: Lenice Llamas, MD ([place])

## 2021-06-01 IMAGING — CT CT HEAD W/O CM
3 series · 14 of 47 positions shown, 16 images · non-contrast
Comparison: None.

CLINICAL DATA: Altered LOC

EXAM:
CT HEAD WITHOUT CONTRAST
TECHNIQUE: Contiguous axial images were obtained from the base of the skull
through the vertex without intravenous contrast.

[Series 2: head wo · axial · 0.43mm/px · z∈[+744,+870]mm · 8 of 31 slices shown, 10 images]
[im 3/31  brain]
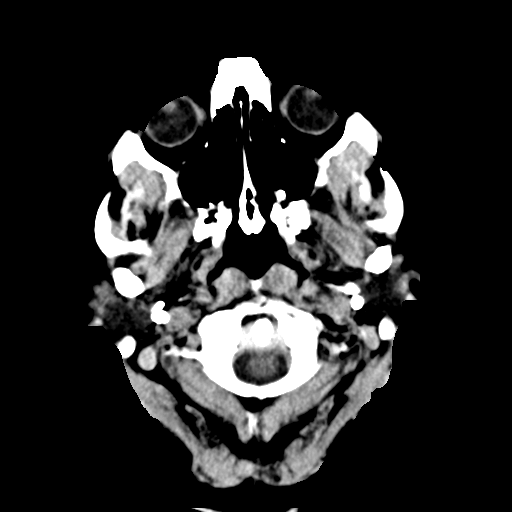
[im 3/31  bone]
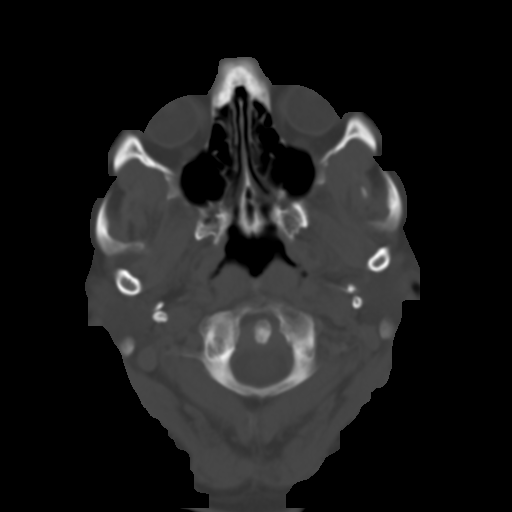
[im 7/31  brain]
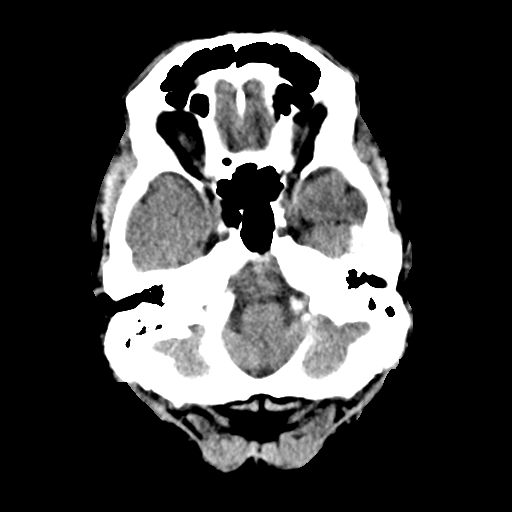
[im 10/31  brain]
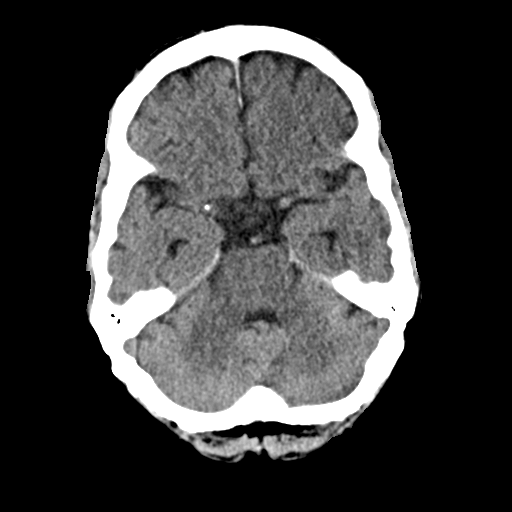
[im 14/31  brain]
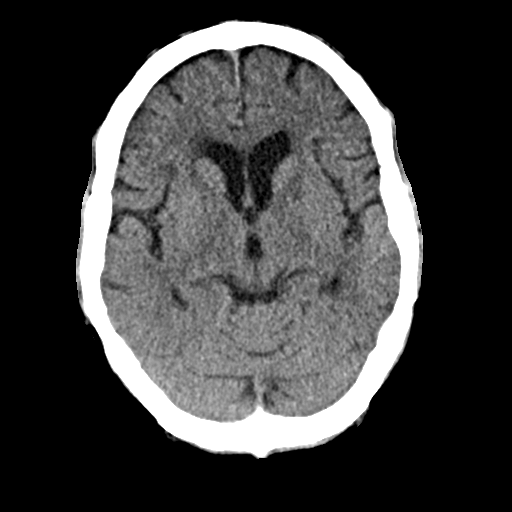
[im 17/31  brain]
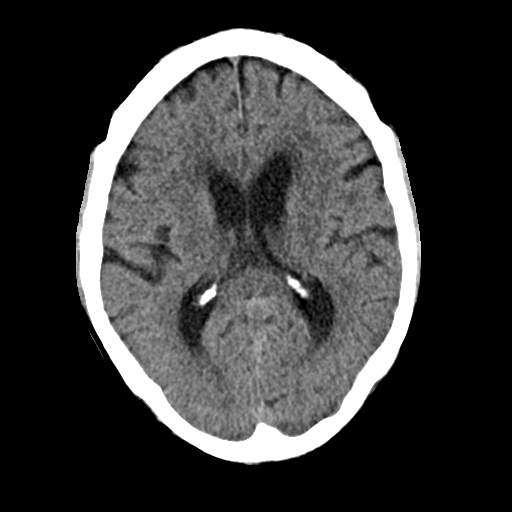
[im 17/31  bone]
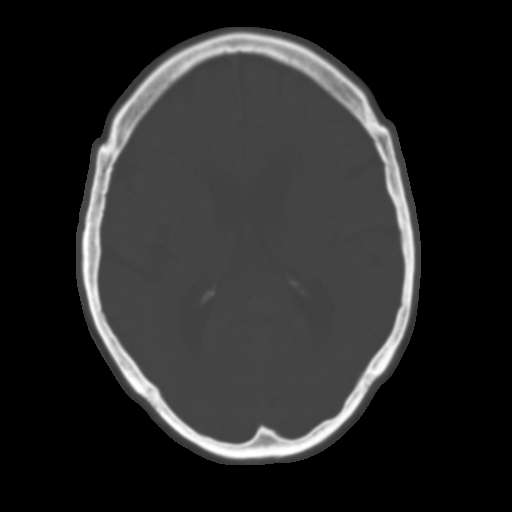
[im 21/31  brain]
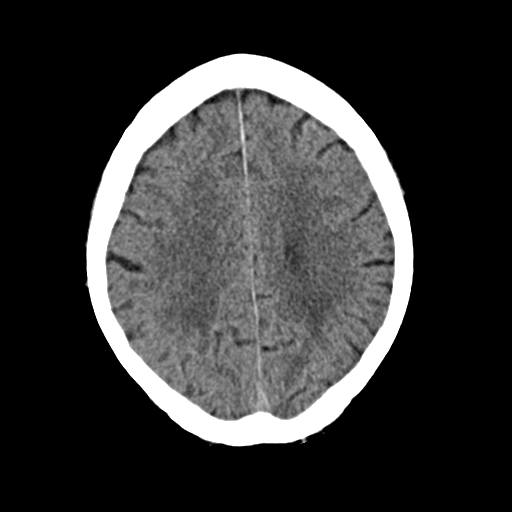
[im 24/31  brain]
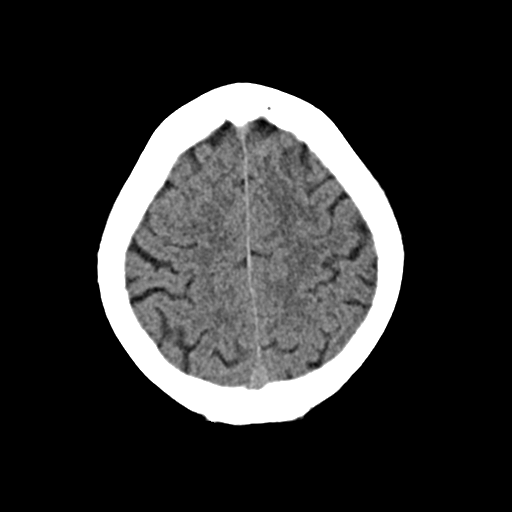
[im 28/31  brain]
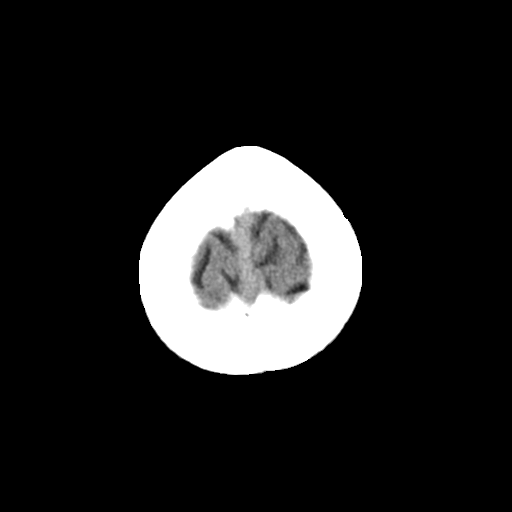

[Series 4: coronal soft · coronal · 0.30mm/px · 3 of 69 slices shown]
[im 23/69  brain]
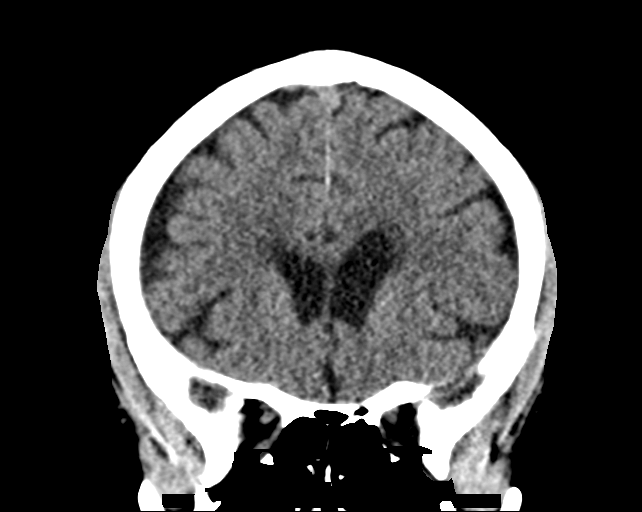
[im 31/69  brain]
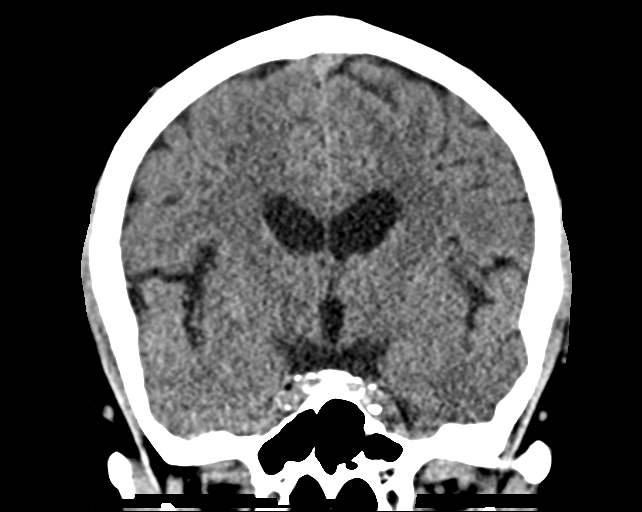
[im 38/69  brain]
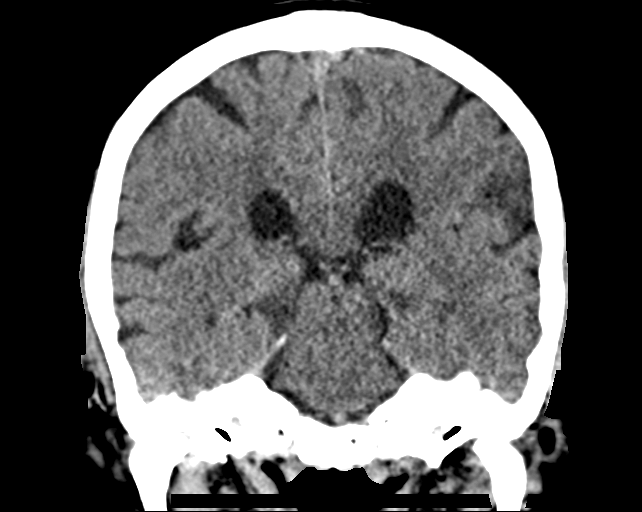

[Series 5: sag soft · sagittal · 0.30mm/px · 3 of 58 slices shown]
[im 20/58  brain]
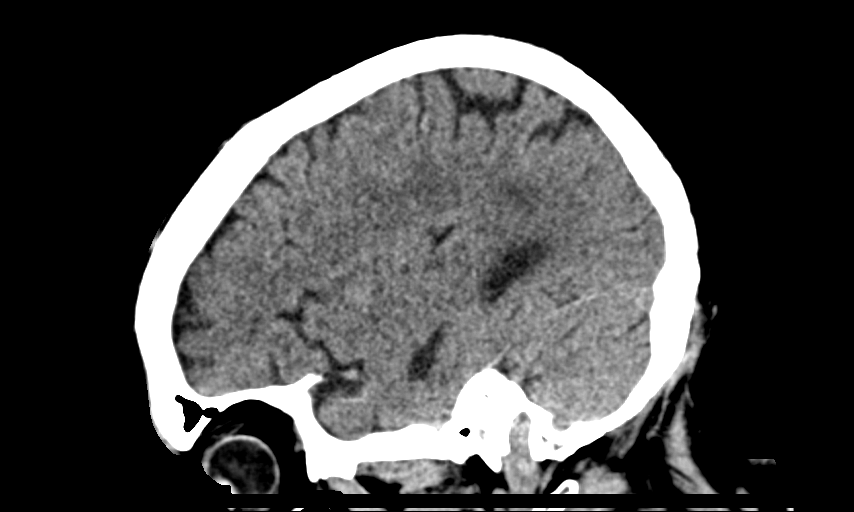
[im 29/58  brain]
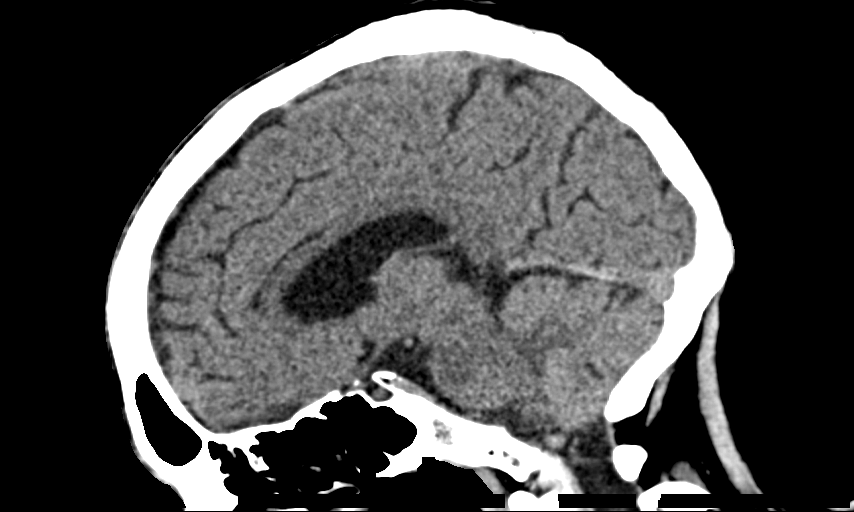
[im 39/58  brain]
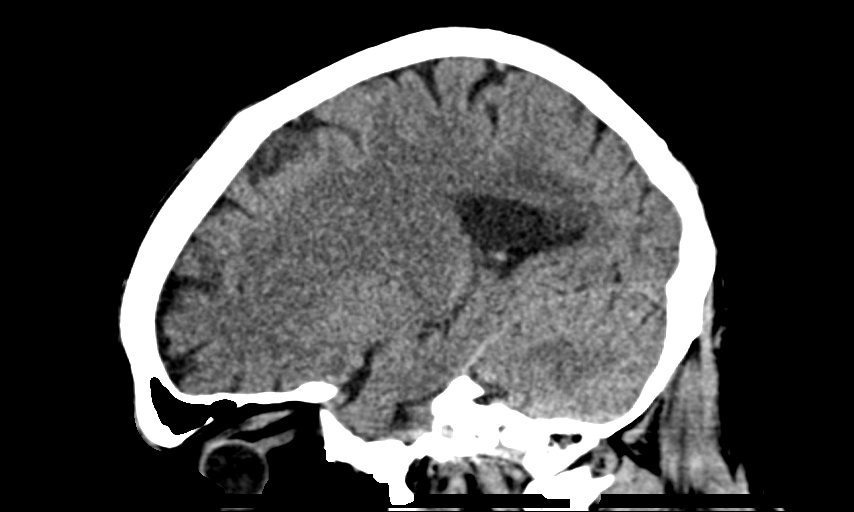

[14 of 47 positions shown; findings below may reference images not displayed]

FINDINGS: Brain: No acute territorial infarction, hemorrhage or intracranial
mass. Mild atrophy. Patchy hypodensity within the periventricular
and deep white matter presumably due to small vessel ischemic
change. Nonenlarged ventricles

Vascular: No hyperdense vessels.  No unexpected calcification

Skull: Normal. Negative for fracture or focal lesion.

Sinuses/Orbits: No acute finding.

Other: None
IMPRESSION: 1. No CT evidence for acute intracranial abnormality.
2. Mild atrophy and small vessel ischemic changes of the white
matter

## 2021-06-06 DIAGNOSIS — D0471 Carcinoma in situ of skin of right lower limb, including hip: Secondary | ICD-10-CM | POA: Diagnosis not present

## 2021-06-06 DIAGNOSIS — D0472 Carcinoma in situ of skin of left lower limb, including hip: Secondary | ICD-10-CM | POA: Diagnosis not present

## 2021-07-25 DIAGNOSIS — Z85828 Personal history of other malignant neoplasm of skin: Secondary | ICD-10-CM | POA: Diagnosis not present

## 2021-07-25 DIAGNOSIS — Z08 Encounter for follow-up examination after completed treatment for malignant neoplasm: Secondary | ICD-10-CM | POA: Diagnosis not present

## 2021-07-25 DIAGNOSIS — C44729 Squamous cell carcinoma of skin of left lower limb, including hip: Secondary | ICD-10-CM | POA: Diagnosis not present

## 2021-08-13 DIAGNOSIS — D122 Benign neoplasm of ascending colon: Secondary | ICD-10-CM | POA: Diagnosis not present

## 2021-08-13 DIAGNOSIS — Z86010 Personal history of colonic polyps: Secondary | ICD-10-CM | POA: Diagnosis not present

## 2021-08-15 DIAGNOSIS — D122 Benign neoplasm of ascending colon: Secondary | ICD-10-CM | POA: Diagnosis not present

## 2021-08-20 ENCOUNTER — Other Ambulatory Visit: Payer: Self-pay | Admitting: Cardiology

## 2021-09-04 DIAGNOSIS — Z23 Encounter for immunization: Secondary | ICD-10-CM | POA: Diagnosis not present

## 2021-10-31 DIAGNOSIS — C44729 Squamous cell carcinoma of skin of left lower limb, including hip: Secondary | ICD-10-CM | POA: Diagnosis not present

## 2021-11-12 DIAGNOSIS — I491 Atrial premature depolarization: Secondary | ICD-10-CM | POA: Diagnosis not present

## 2021-11-12 DIAGNOSIS — I129 Hypertensive chronic kidney disease with stage 1 through stage 4 chronic kidney disease, or unspecified chronic kidney disease: Secondary | ICD-10-CM | POA: Diagnosis not present

## 2021-11-12 DIAGNOSIS — N1831 Chronic kidney disease, stage 3a: Secondary | ICD-10-CM | POA: Diagnosis not present

## 2021-11-12 DIAGNOSIS — E039 Hypothyroidism, unspecified: Secondary | ICD-10-CM | POA: Diagnosis not present

## 2021-11-12 DIAGNOSIS — E785 Hyperlipidemia, unspecified: Secondary | ICD-10-CM | POA: Diagnosis not present

## 2021-12-12 DIAGNOSIS — Z85828 Personal history of other malignant neoplasm of skin: Secondary | ICD-10-CM | POA: Diagnosis not present

## 2021-12-12 DIAGNOSIS — L308 Other specified dermatitis: Secondary | ICD-10-CM | POA: Diagnosis not present

## 2021-12-12 DIAGNOSIS — C44722 Squamous cell carcinoma of skin of right lower limb, including hip: Secondary | ICD-10-CM | POA: Diagnosis not present

## 2021-12-12 DIAGNOSIS — Z08 Encounter for follow-up examination after completed treatment for malignant neoplasm: Secondary | ICD-10-CM | POA: Diagnosis not present

## 2022-01-15 DIAGNOSIS — R21 Rash and other nonspecific skin eruption: Secondary | ICD-10-CM | POA: Diagnosis not present

## 2022-01-15 DIAGNOSIS — J3089 Other allergic rhinitis: Secondary | ICD-10-CM | POA: Diagnosis not present

## 2022-01-23 DIAGNOSIS — Z85828 Personal history of other malignant neoplasm of skin: Secondary | ICD-10-CM | POA: Diagnosis not present

## 2022-01-23 DIAGNOSIS — Z08 Encounter for follow-up examination after completed treatment for malignant neoplasm: Secondary | ICD-10-CM | POA: Diagnosis not present

## 2022-01-23 DIAGNOSIS — L258 Unspecified contact dermatitis due to other agents: Secondary | ICD-10-CM | POA: Diagnosis not present

## 2022-02-13 DIAGNOSIS — N401 Enlarged prostate with lower urinary tract symptoms: Secondary | ICD-10-CM | POA: Diagnosis not present

## 2022-02-13 DIAGNOSIS — K4090 Unilateral inguinal hernia, without obstruction or gangrene, not specified as recurrent: Secondary | ICD-10-CM | POA: Diagnosis not present

## 2022-02-13 DIAGNOSIS — R3912 Poor urinary stream: Secondary | ICD-10-CM | POA: Diagnosis not present

## 2022-02-16 ENCOUNTER — Other Ambulatory Visit: Payer: Self-pay | Admitting: Cardiology

## 2022-02-18 NOTE — Telephone Encounter (Signed)
Refill request

## 2022-04-17 ENCOUNTER — Other Ambulatory Visit: Payer: Self-pay | Admitting: Cardiology

## 2022-04-17 DIAGNOSIS — I10 Essential (primary) hypertension: Secondary | ICD-10-CM

## 2022-04-21 ENCOUNTER — Other Ambulatory Visit: Payer: Self-pay | Admitting: Cardiology

## 2022-05-08 DIAGNOSIS — E785 Hyperlipidemia, unspecified: Secondary | ICD-10-CM | POA: Diagnosis not present

## 2022-05-08 DIAGNOSIS — R7989 Other specified abnormal findings of blood chemistry: Secondary | ICD-10-CM | POA: Diagnosis not present

## 2022-05-08 DIAGNOSIS — Z1212 Encounter for screening for malignant neoplasm of rectum: Secondary | ICD-10-CM | POA: Diagnosis not present

## 2022-05-08 DIAGNOSIS — Z0000 Encounter for general adult medical examination without abnormal findings: Secondary | ICD-10-CM | POA: Diagnosis not present

## 2022-05-08 DIAGNOSIS — E039 Hypothyroidism, unspecified: Secondary | ICD-10-CM | POA: Diagnosis not present

## 2022-05-08 DIAGNOSIS — Z125 Encounter for screening for malignant neoplasm of prostate: Secondary | ICD-10-CM | POA: Diagnosis not present

## 2022-05-13 ENCOUNTER — Ambulatory Visit: Payer: Medicare Other | Admitting: Cardiology

## 2022-05-22 DIAGNOSIS — N1831 Chronic kidney disease, stage 3a: Secondary | ICD-10-CM | POA: Diagnosis not present

## 2022-05-22 DIAGNOSIS — E039 Hypothyroidism, unspecified: Secondary | ICD-10-CM | POA: Diagnosis not present

## 2022-05-22 DIAGNOSIS — I491 Atrial premature depolarization: Secondary | ICD-10-CM | POA: Diagnosis not present

## 2022-05-22 DIAGNOSIS — Z23 Encounter for immunization: Secondary | ICD-10-CM | POA: Diagnosis not present

## 2022-05-22 DIAGNOSIS — Z1339 Encounter for screening examination for other mental health and behavioral disorders: Secondary | ICD-10-CM | POA: Diagnosis not present

## 2022-05-22 DIAGNOSIS — R82998 Other abnormal findings in urine: Secondary | ICD-10-CM | POA: Diagnosis not present

## 2022-05-22 DIAGNOSIS — E785 Hyperlipidemia, unspecified: Secondary | ICD-10-CM | POA: Diagnosis not present

## 2022-05-22 DIAGNOSIS — I129 Hypertensive chronic kidney disease with stage 1 through stage 4 chronic kidney disease, or unspecified chronic kidney disease: Secondary | ICD-10-CM | POA: Diagnosis not present

## 2022-05-22 DIAGNOSIS — Z1331 Encounter for screening for depression: Secondary | ICD-10-CM | POA: Diagnosis not present

## 2022-05-22 DIAGNOSIS — Z0000 Encounter for general adult medical examination without abnormal findings: Secondary | ICD-10-CM | POA: Diagnosis not present

## 2022-05-23 ENCOUNTER — Ambulatory Visit: Payer: Medicare Other | Admitting: Cardiology

## 2022-05-23 ENCOUNTER — Encounter: Payer: Self-pay | Admitting: Cardiology

## 2022-05-23 VITALS — BP 124/76 | HR 57 | Temp 98.6°F | Resp 16 | Ht 68.0 in | Wt 143.6 lb

## 2022-05-23 DIAGNOSIS — R002 Palpitations: Secondary | ICD-10-CM | POA: Diagnosis not present

## 2022-05-23 DIAGNOSIS — Z5181 Encounter for therapeutic drug level monitoring: Secondary | ICD-10-CM

## 2022-05-23 DIAGNOSIS — I10 Essential (primary) hypertension: Secondary | ICD-10-CM

## 2022-05-23 NOTE — Progress Notes (Signed)
Primary Physician/Referring:  Burnard Bunting, MD  Patient ID: Jeffery Garrett, male    DOB: [birth year], 81 y.o.   MRN: [id number]  Chief Complaint  Patient presents with  . Hypertension  . Palpitations  . Follow-up    1 year   HPI:    Jeffery Garrett  is a 81 y.o.   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 He continues to lead an active lifestyle, bikes 15-20 miles 2-3 times a week and also hikes mountains frequently. After he received Covid vaccine, he is developed severe immunologic reaction including worsening skin rash, worsening tremors, worsening abdominal bloating, and worsening palpitations. He also had sudden drop in Max VO2. Most symptoms have resolved except Max VO2 has been slow to recover. He also has white coat hypertension.  He is presently doing well, symptoms of palpitations are very stable, denies any dyspnea, chest pain, dizziness or syncope.   Past Medical History:  Diagnosis Date  . A-fib ([place])   . Hyperlipidemia   . Hypertension    Past Surgical History:  Procedure Laterality Date  . ABLATION  2007  . ATRIAL FIBRILLATION ABLATION    . neck fusion  1995   Social History   Tobacco Use  . Smoking status: Never  . Smokeless tobacco: Never  Substance Use Topics  . Alcohol use: Yes    Comment: rare   Marital status: Married   ROS  Review of Systems  Constitutional: Negative for malaise/fatigue.  Cardiovascular:  Negative for dyspnea on exertion, leg swelling and palpitations.  Gastrointestinal:  Negative for melena and nausea.  Neurological:  Positive for tremors.   Objective  Blood pressure 124/76, pulse (!) 57, temperature 98.6 F (37 C), temperature source Temporal, resp. rate 16, height $RemoveBe'5\' 8"'QTpQMIFWH$  (1.727 m), weight 143 lb 9.6 oz (65.1 kg), SpO2 99 %. Body  mass index is 21.83 kg/m.      05/23/2022    9:41 AM 05/23/2022    9:03 AM 05/11/2021    9:02 AM  Vitals with BMI  Height  $Remov'5\' 8"'RHDSHd$    Weight  143 lbs 10 oz   BMI  74.82   Systolic 707 867 544  Diastolic 76 85 83  Pulse  57 62    Physical Exam Constitutional:      Appearance: He is well-developed.  Cardiovascular:     Rate and Rhythm: Regular rhythm. Bradycardia present.     Pulses: Normal pulses and intact distal pulses.     Heart sounds: S1 normal and S2 normal. No murmur heard.    No gallop.  Pulmonary:     Effort: Pulmonary effort is normal.     Breath sounds: Normal breath sounds.  Abdominal:     General: Bowel sounds are normal.     Palpations: Abdomen is soft.   Radiology: No results found.  Laboratory examination:   External Labs 04/05/2019:   Labs 05/19/2022:  BUN 22, creatinine 1.0, EGFR 71 mL, potassium 4.3, sodium 138.  Hb 14.1/HCT 39.8, platelets 188.  Total cholesterol 173, triglycerides 49, HDL 56, LDL 107.  TSH normal at 2.52.  PSA minimally elevated at 4.108.  Urine analysis normal.  Cholesterol, total 244.000 m 10/06/2020 HDL 55.000 mg 10/06/2020 LDL 173.000 m 10/06/2020 Triglycerides 78.000 mg 10/06/2020  Hemoglobin 12.900 g/d 04/10/2020  TSH 1.460 04/10/2020  Serum  glucose 97 mg, BUN 20, creatinine 1.1, sodium 129, potassium 4.5, CMP otherwise normal, CBC normal.   Total cholesterol 153, triglycerides 90, HDL 50, LDL 82, nondistended cholesterol 100.  TSH, free T4 and PSA normal.  Medications   Current Outpatient Medications on File Prior to Visit  Medication Sig Dispense Refill  . amLODipine (NORVASC) 5 MG tablet TAKE 1 TABLET(5 MG) BY MOUTH DAILY 90 tablet 3  . flecainide (TAMBOCOR) 50 MG tablet TAKE 1 TABLET BY MOUTH DAILY 90 tablet 3  . Magnesium Oxide 500 MG CAPS Take by mouth. occasionally    . Omega-3 Fatty Acids (FISH OIL) 1000 MG CAPS Take by mouth.    . rosuvastatin (CRESTOR) 10 MG tablet TAKE 1 TABLET(10 MG) BY MOUTH DAILY 90 tablet  2  . SYNTHROID 100 MCG tablet Take 100 mcg by mouth daily before breakfast.     . vitamin C (ASCORBIC ACID) 500 MG tablet Take 500 mg by mouth daily.     No current facility-administered medications on file prior to visit.    Cardiac Studies:   Echocardiogram 12/07/2020: Left ventricle cavity is normal in size and wall thickness. Normal global wall motion. Normal LV systolic function with EF 54%. Normal diastolic filling pattern. Mild tricuspid regurgitation. No evidence of pulmonary hypertension. No significant change compared to previous study in 2016.  EKG: EKG 05/23/2022: Sinus rhythm with first-degree AV block at rate of 56 bpm, otherwise normal EKG.  No significant change from prior EKG 05/11/2021.   Assessment      ICD-10-CM   1. Essential hypertension  I10     2. Palpitations  R00.2 EKG 12-Lead    3. Encounter for therapeutic drug monitoring  Z51.81      No orders of the defined types were placed in this encounter.   There are no discontinued medications.   Recommendations:   Jeffery Garrett  is a 81 y.o. 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 He continues to lead an active lifestyle, bikes 15-20 miles 2-3 times a week and also hikes mountains frequently. After he received Covid vaccine, he is developed severe immunologic reaction including worsening skin rash, worsening tremors, worsening abdominal bloating, and worsening palpitations. He also had sudden drop in Max VO2. Most symptoms have resolved except Max VO2 has been slow to recover. He also has white coat hypertension.  He is presently doing well, symptoms of palpitations are very stable, denies any dyspnea, chest pain, dizziness or syncope.  Blood pressure under excellent control although he has whitecoat hypertension and was  markedly elevated upon presentation.  I reviewed his external labs, sodium levels have been stable, he has not been drinking plain water incessantly anymore.  No change in his physical exam, I will see him back in a year or sooner if problems.  QT interval is remained stable on flecainide.     Adrian Prows, MD, [facility] 05/23/2022, 9:42 AM Office: [phone] Pager: [phone]   CC: Lenice Llamas, MD ([place])

## 2022-05-30 DIAGNOSIS — M25562 Pain in left knee: Secondary | ICD-10-CM | POA: Diagnosis not present

## 2022-05-30 DIAGNOSIS — M170 Bilateral primary osteoarthritis of knee: Secondary | ICD-10-CM | POA: Diagnosis not present

## 2022-05-30 DIAGNOSIS — M25561 Pain in right knee: Secondary | ICD-10-CM | POA: Diagnosis not present

## 2022-05-30 DIAGNOSIS — M1711 Unilateral primary osteoarthritis, right knee: Secondary | ICD-10-CM | POA: Diagnosis not present

## 2022-06-12 DIAGNOSIS — M25561 Pain in right knee: Secondary | ICD-10-CM | POA: Diagnosis not present

## 2022-06-12 DIAGNOSIS — M1711 Unilateral primary osteoarthritis, right knee: Secondary | ICD-10-CM | POA: Diagnosis not present

## 2022-06-13 DIAGNOSIS — M1711 Unilateral primary osteoarthritis, right knee: Secondary | ICD-10-CM | POA: Diagnosis not present

## 2022-06-13 DIAGNOSIS — M25561 Pain in right knee: Secondary | ICD-10-CM | POA: Diagnosis not present

## 2022-06-26 DIAGNOSIS — M1711 Unilateral primary osteoarthritis, right knee: Secondary | ICD-10-CM | POA: Diagnosis not present

## 2022-06-26 DIAGNOSIS — D0472 Carcinoma in situ of skin of left lower limb, including hip: Secondary | ICD-10-CM | POA: Diagnosis not present

## 2022-06-26 DIAGNOSIS — M25561 Pain in right knee: Secondary | ICD-10-CM | POA: Diagnosis not present

## 2022-06-27 DIAGNOSIS — M1712 Unilateral primary osteoarthritis, left knee: Secondary | ICD-10-CM | POA: Diagnosis not present

## 2022-06-27 DIAGNOSIS — M25562 Pain in left knee: Secondary | ICD-10-CM | POA: Diagnosis not present

## 2022-07-11 DIAGNOSIS — M25562 Pain in left knee: Secondary | ICD-10-CM | POA: Diagnosis not present

## 2022-07-11 DIAGNOSIS — M1712 Unilateral primary osteoarthritis, left knee: Secondary | ICD-10-CM | POA: Diagnosis not present

## 2022-09-16 DIAGNOSIS — M545 Low back pain, unspecified: Secondary | ICD-10-CM | POA: Diagnosis not present

## 2022-09-23 DIAGNOSIS — M4316 Spondylolisthesis, lumbar region: Secondary | ICD-10-CM | POA: Diagnosis not present

## 2022-09-23 DIAGNOSIS — S32030A Wedge compression fracture of third lumbar vertebra, initial encounter for closed fracture: Secondary | ICD-10-CM | POA: Diagnosis not present

## 2022-09-30 DIAGNOSIS — M545 Low back pain, unspecified: Secondary | ICD-10-CM | POA: Diagnosis not present

## 2022-10-01 DIAGNOSIS — M5416 Radiculopathy, lumbar region: Secondary | ICD-10-CM | POA: Diagnosis not present

## 2022-10-01 DIAGNOSIS — M4316 Spondylolisthesis, lumbar region: Secondary | ICD-10-CM | POA: Diagnosis not present

## 2022-10-02 DIAGNOSIS — Z23 Encounter for immunization: Secondary | ICD-10-CM | POA: Diagnosis not present

## 2022-10-14 DIAGNOSIS — M5416 Radiculopathy, lumbar region: Secondary | ICD-10-CM | POA: Diagnosis not present

## 2022-10-15 DIAGNOSIS — S335XXD Sprain of ligaments of lumbar spine, subsequent encounter: Secondary | ICD-10-CM | POA: Diagnosis not present

## 2022-11-13 DIAGNOSIS — M47816 Spondylosis without myelopathy or radiculopathy, lumbar region: Secondary | ICD-10-CM | POA: Diagnosis not present

## 2022-11-13 DIAGNOSIS — M4316 Spondylolisthesis, lumbar region: Secondary | ICD-10-CM | POA: Diagnosis not present

## 2022-11-20 DIAGNOSIS — E785 Hyperlipidemia, unspecified: Secondary | ICD-10-CM | POA: Diagnosis not present

## 2022-11-20 DIAGNOSIS — N1831 Chronic kidney disease, stage 3a: Secondary | ICD-10-CM | POA: Diagnosis not present

## 2022-11-20 DIAGNOSIS — I129 Hypertensive chronic kidney disease with stage 1 through stage 4 chronic kidney disease, or unspecified chronic kidney disease: Secondary | ICD-10-CM | POA: Diagnosis not present

## 2022-11-22 ENCOUNTER — Ambulatory Visit: Payer: Medicare Other | Admitting: Cardiology

## 2022-12-03 ENCOUNTER — Encounter: Payer: Self-pay | Admitting: Cardiology

## 2022-12-03 ENCOUNTER — Ambulatory Visit: Payer: Medicare Other | Admitting: Cardiology

## 2022-12-03 VITALS — BP 136/80 | HR 66 | Ht 68.0 in | Wt 147.0 lb

## 2022-12-03 DIAGNOSIS — Z9889 Other specified postprocedural states: Secondary | ICD-10-CM

## 2022-12-03 DIAGNOSIS — Z8679 Personal history of other diseases of the circulatory system: Secondary | ICD-10-CM | POA: Diagnosis not present

## 2022-12-03 DIAGNOSIS — Z5181 Encounter for therapeutic drug level monitoring: Secondary | ICD-10-CM | POA: Diagnosis not present

## 2022-12-03 DIAGNOSIS — I10 Essential (primary) hypertension: Secondary | ICD-10-CM

## 2022-12-03 DIAGNOSIS — E7800 Pure hypercholesterolemia, unspecified: Secondary | ICD-10-CM | POA: Diagnosis not present

## 2022-12-03 NOTE — Progress Notes (Signed)
Primary Physician/Referring:  Burnard Bunting, MD  Patient ID: Jeffery Garrett, male    DOB: 1941/03/22, 82 y.o.   MRN: [id number]  Chief Complaint  Patient presents with   Hypertension   Follow-up    6 month   HPI:    Jeffery Garrett  is a 82 y.o.   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 He continues to lead an active lifestyle, bikes 15-20 miles 2-3 times a week and also hikes mountains frequently. After he received Covid vaccine, he is developed severe immunologic reaction including worsening skin rash, worsening tremors, worsening abdominal bloating, and worsening palpitations. He also had sudden drop in Max VO2. Most symptoms have resolved except Max VO2 has been slow to recover. He also has white coat hypertension.  He is presently doing well, symptoms of palpitations are very stable, denies any dyspnea, chest pain, dizziness or syncope.   Past Medical History:  Diagnosis Date   A-fib ([place])    Hyperlipidemia    Hypertension    Past Surgical History:  Procedure Laterality Date   ABLATION  2007   ATRIAL FIBRILLATION ABLATION     neck fusion  1995   Social History   Tobacco Use   Smoking status: Never   Smokeless tobacco: Never  Substance Use Topics   Alcohol use: Yes    Comment: rare   Marital status: Married   ROS  Review of Systems  Constitutional: Negative for malaise/fatigue.  Cardiovascular:  Negative for dyspnea on exertion, leg swelling and palpitations.  Gastrointestinal:  Negative for melena and nausea.  Neurological:  Positive for tremors.   Objective  Blood pressure 136/80, pulse 66, height _0  (1.727 m), weight 147 lb (66.7 kg). Body mass index is 22.35 kg/m.      12/03/2022    9:53 AM 05/23/2022    9:41 AM 05/23/2022    9:03 AM  Vitals with BMI   Height _1   _2   Weight 147 lbs  143 lbs 10 oz  BMI 36.62  94.76  Systolic 546 503 546  Diastolic 80 76 85  Pulse 66  57    Physical Exam Constitutional:      Appearance: He is well-developed.  Cardiovascular:     Rate and Rhythm: Regular rhythm. Bradycardia present.     Pulses: Normal pulses and intact distal pulses.     Heart sounds: S1 normal and S2 normal. No murmur heard.    No gallop.  Pulmonary:     Effort: Pulmonary effort is normal.     Breath sounds: Normal breath sounds.  Abdominal:     General: Bowel sounds are normal.     Palpations: Abdomen is soft.    Radiology: No results found.  Laboratory examination:   External Labs 04/05/2019:   Labs 05/19/2022:  BUN 22, creatinine 1.0, EGFR 75 mL.  Potassium 4.2.  LFTs normal.  Hb 14.1/HCT 39.8, platelets 188, normal indicis.  Total cholesterol 173, triglycerides 49, HDL 56, LDL 107.  TSH normal at 2.52.  PSA minimally elevated at 4.108 (0.00-4.00).   Medications and allergies   No Known Allergies    Current Outpatient Medications:    amLODipine (NORVASC) 5 MG tablet, TAKE 1 TABLET(5 MG) BY MOUTH DAILY, Disp: 90 tablet, Rfl: 3   flecainide (TAMBOCOR) 50 MG tablet, TAKE 1  TABLET BY MOUTH DAILY, Disp: 90 tablet, Rfl: 3   Magnesium Oxide 500 MG CAPS, Take by mouth. occasionally, Disp: , Rfl:    Omega-3 Fatty Acids (FISH OIL) 1000 MG CAPS, Take by mouth., Disp: , Rfl:    rosuvastatin (CRESTOR) 10 MG tablet, TAKE 1 TABLET(10 MG) BY MOUTH DAILY, Disp: 90 tablet, Rfl: 2   SYNTHROID 100 MCG tablet, Take 100 mcg by mouth daily before breakfast. , Disp: , Rfl:    vitamin C (ASCORBIC ACID) 500 MG tablet, Take 500 mg by mouth daily., Disp: , Rfl:     Cardiac Studies:   Echocardiogram 12/07/2020: Left ventricle cavity is normal in size and wall thickness. Normal global wall motion. Normal LV systolic function with EF 54%. Normal diastolic filling pattern. Mild tricuspid regurgitation. No evidence of pulmonary  hypertension. No significant change compared to previous study in 2016.  EKG:  EKG 12/03/2022: Sinus bradycardia with first-degree AV block at rate of 57 bpm, left atrial enlargement, otherwise normal EKG.  Compared to 05/23/2022, no significant change.   Assessment      ICD-10-CM   1. Essential hypertension  I10 EKG 12-Lead    2. White coat syndrome with diagnosis of hypertension  I10     3. S/P ablation of atrial fibrillation  Z98.890    Z86.79     4. Therapeutic drug monitoring  Z51.81     5. Hypercholesteremia  E78.00      No orders of the defined types were placed in this encounter.   There are no discontinued medications.   Recommendations:   Jeffery Garrett  is a 82 y.o. 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 He continues to lead an active lifestyle, bikes 15-20 miles 2-3 times a week and also hikes mountains frequently.   1. Essential hypertension Blood pressure is well-controlled he is home recordings have been 156/70 mmHg.  Slightly elevated here but I did not make any changes to his medications.  External labs reviewed, renal function is normal.  2. White coat syndrome with diagnosis of hypertension This is a known phenomenon for him, overall stable blood pressure status.  3. S/P ablation of atrial fibrillation He has not had any recurrence of atrial fibrillation, he is only on very small dose of flecainide for many years at 50 mg once a day, continue same.  No change in his EKG or QT interval.  4.  Therapeutic drug monitoring Patient is presently on flecainide, QT interval is normal, continues to remain active.  No dizziness or syncope.  5. Hypercholesteremia Lipids review of absent other risk factors for vascular disease, will continue monitoring this only.  He is on 10 mg  of Crestor, continue the same.       Jeffery Prows, MD, [facility] 12/03/2022, 10:16 AM Office: [phone] Pager: [phone]

## 2022-12-09 DIAGNOSIS — M4316 Spondylolisthesis, lumbar region: Secondary | ICD-10-CM | POA: Diagnosis not present

## 2023-01-08 ENCOUNTER — Other Ambulatory Visit: Payer: Self-pay | Admitting: Cardiology

## 2023-01-08 NOTE — Telephone Encounter (Signed)
Can I refill flecainide? 

## 2023-01-21 DIAGNOSIS — M4316 Spondylolisthesis, lumbar region: Secondary | ICD-10-CM | POA: Diagnosis not present

## 2023-04-02 DIAGNOSIS — L84 Corns and callosities: Secondary | ICD-10-CM | POA: Diagnosis not present

## 2023-05-06 DIAGNOSIS — H5213 Myopia, bilateral: Secondary | ICD-10-CM | POA: Diagnosis not present

## 2023-05-06 DIAGNOSIS — H35363 Drusen (degenerative) of macula, bilateral: Secondary | ICD-10-CM | POA: Diagnosis not present

## 2023-05-12 ENCOUNTER — Other Ambulatory Visit: Payer: Self-pay | Admitting: Cardiology

## 2023-05-12 DIAGNOSIS — I10 Essential (primary) hypertension: Secondary | ICD-10-CM

## 2023-05-19 DIAGNOSIS — E039 Hypothyroidism, unspecified: Secondary | ICD-10-CM | POA: Diagnosis not present

## 2023-05-19 DIAGNOSIS — N183 Chronic kidney disease, stage 3 unspecified: Secondary | ICD-10-CM | POA: Diagnosis not present

## 2023-05-19 DIAGNOSIS — I10 Essential (primary) hypertension: Secondary | ICD-10-CM | POA: Diagnosis not present

## 2023-05-19 DIAGNOSIS — R7989 Other specified abnormal findings of blood chemistry: Secondary | ICD-10-CM | POA: Diagnosis not present

## 2023-05-19 DIAGNOSIS — E785 Hyperlipidemia, unspecified: Secondary | ICD-10-CM | POA: Diagnosis not present

## 2023-05-19 DIAGNOSIS — Z125 Encounter for screening for malignant neoplasm of prostate: Secondary | ICD-10-CM | POA: Diagnosis not present

## 2023-05-23 DIAGNOSIS — M25562 Pain in left knee: Secondary | ICD-10-CM | POA: Diagnosis not present

## 2023-05-23 DIAGNOSIS — M25561 Pain in right knee: Secondary | ICD-10-CM | POA: Diagnosis not present

## 2023-05-23 DIAGNOSIS — M170 Bilateral primary osteoarthritis of knee: Secondary | ICD-10-CM | POA: Diagnosis not present

## 2023-05-23 DIAGNOSIS — R262 Difficulty in walking, not elsewhere classified: Secondary | ICD-10-CM | POA: Diagnosis not present

## 2023-05-26 DIAGNOSIS — Z0000 Encounter for general adult medical examination without abnormal findings: Secondary | ICD-10-CM | POA: Diagnosis not present

## 2023-05-26 DIAGNOSIS — R82998 Other abnormal findings in urine: Secondary | ICD-10-CM | POA: Diagnosis not present

## 2023-05-26 DIAGNOSIS — E785 Hyperlipidemia, unspecified: Secondary | ICD-10-CM | POA: Diagnosis not present

## 2023-05-26 DIAGNOSIS — I129 Hypertensive chronic kidney disease with stage 1 through stage 4 chronic kidney disease, or unspecified chronic kidney disease: Secondary | ICD-10-CM | POA: Diagnosis not present

## 2023-05-26 DIAGNOSIS — E039 Hypothyroidism, unspecified: Secondary | ICD-10-CM | POA: Diagnosis not present

## 2023-05-26 DIAGNOSIS — Z23 Encounter for immunization: Secondary | ICD-10-CM | POA: Diagnosis not present

## 2023-05-26 DIAGNOSIS — R972 Elevated prostate specific antigen [PSA]: Secondary | ICD-10-CM | POA: Diagnosis not present

## 2023-05-26 DIAGNOSIS — Z1331 Encounter for screening for depression: Secondary | ICD-10-CM | POA: Diagnosis not present

## 2023-05-26 DIAGNOSIS — M40299 Other kyphosis, site unspecified: Secondary | ICD-10-CM | POA: Diagnosis not present

## 2023-05-26 DIAGNOSIS — N1831 Chronic kidney disease, stage 3a: Secondary | ICD-10-CM | POA: Diagnosis not present

## 2023-05-26 DIAGNOSIS — Z1339 Encounter for screening examination for other mental health and behavioral disorders: Secondary | ICD-10-CM | POA: Diagnosis not present

## 2023-05-30 DIAGNOSIS — M25561 Pain in right knee: Secondary | ICD-10-CM | POA: Diagnosis not present

## 2023-05-30 DIAGNOSIS — M25562 Pain in left knee: Secondary | ICD-10-CM | POA: Diagnosis not present

## 2023-05-30 DIAGNOSIS — R262 Difficulty in walking, not elsewhere classified: Secondary | ICD-10-CM | POA: Diagnosis not present

## 2023-05-30 DIAGNOSIS — M170 Bilateral primary osteoarthritis of knee: Secondary | ICD-10-CM | POA: Diagnosis not present

## 2023-06-13 DIAGNOSIS — M25561 Pain in right knee: Secondary | ICD-10-CM | POA: Diagnosis not present

## 2023-06-13 DIAGNOSIS — M25562 Pain in left knee: Secondary | ICD-10-CM | POA: Diagnosis not present

## 2023-06-13 DIAGNOSIS — M170 Bilateral primary osteoarthritis of knee: Secondary | ICD-10-CM | POA: Diagnosis not present

## 2023-06-13 DIAGNOSIS — R262 Difficulty in walking, not elsewhere classified: Secondary | ICD-10-CM | POA: Diagnosis not present

## 2023-06-20 DIAGNOSIS — R262 Difficulty in walking, not elsewhere classified: Secondary | ICD-10-CM | POA: Diagnosis not present

## 2023-06-20 DIAGNOSIS — M25561 Pain in right knee: Secondary | ICD-10-CM | POA: Diagnosis not present

## 2023-06-20 DIAGNOSIS — M25562 Pain in left knee: Secondary | ICD-10-CM | POA: Diagnosis not present

## 2023-06-20 DIAGNOSIS — M170 Bilateral primary osteoarthritis of knee: Secondary | ICD-10-CM | POA: Diagnosis not present

## 2023-07-02 DIAGNOSIS — M25562 Pain in left knee: Secondary | ICD-10-CM | POA: Diagnosis not present

## 2023-07-02 DIAGNOSIS — M25561 Pain in right knee: Secondary | ICD-10-CM | POA: Diagnosis not present

## 2023-07-02 DIAGNOSIS — R262 Difficulty in walking, not elsewhere classified: Secondary | ICD-10-CM | POA: Diagnosis not present

## 2023-07-02 DIAGNOSIS — M170 Bilateral primary osteoarthritis of knee: Secondary | ICD-10-CM | POA: Diagnosis not present

## 2023-07-09 DIAGNOSIS — M170 Bilateral primary osteoarthritis of knee: Secondary | ICD-10-CM | POA: Diagnosis not present

## 2023-07-09 DIAGNOSIS — R262 Difficulty in walking, not elsewhere classified: Secondary | ICD-10-CM | POA: Diagnosis not present

## 2023-07-09 DIAGNOSIS — M25561 Pain in right knee: Secondary | ICD-10-CM | POA: Diagnosis not present

## 2023-07-09 DIAGNOSIS — M25562 Pain in left knee: Secondary | ICD-10-CM | POA: Diagnosis not present

## 2023-09-07 DIAGNOSIS — Z23 Encounter for immunization: Secondary | ICD-10-CM | POA: Diagnosis not present

## 2023-10-16 DIAGNOSIS — M549 Dorsalgia, unspecified: Secondary | ICD-10-CM | POA: Diagnosis not present

## 2023-10-16 DIAGNOSIS — R6883 Chills (without fever): Secondary | ICD-10-CM | POA: Diagnosis not present

## 2023-10-16 DIAGNOSIS — R972 Elevated prostate specific antigen [PSA]: Secondary | ICD-10-CM | POA: Diagnosis not present

## 2023-11-03 DIAGNOSIS — I491 Atrial premature depolarization: Secondary | ICD-10-CM | POA: Diagnosis not present

## 2023-11-03 DIAGNOSIS — E039 Hypothyroidism, unspecified: Secondary | ICD-10-CM | POA: Diagnosis not present

## 2023-11-03 DIAGNOSIS — N1831 Chronic kidney disease, stage 3a: Secondary | ICD-10-CM | POA: Diagnosis not present

## 2023-11-03 DIAGNOSIS — E785 Hyperlipidemia, unspecified: Secondary | ICD-10-CM | POA: Diagnosis not present

## 2023-11-03 DIAGNOSIS — I129 Hypertensive chronic kidney disease with stage 1 through stage 4 chronic kidney disease, or unspecified chronic kidney disease: Secondary | ICD-10-CM | POA: Diagnosis not present

## 2023-11-04 DIAGNOSIS — N401 Enlarged prostate with lower urinary tract symptoms: Secondary | ICD-10-CM | POA: Diagnosis not present

## 2023-11-04 DIAGNOSIS — R3912 Poor urinary stream: Secondary | ICD-10-CM | POA: Diagnosis not present

## 2023-11-04 DIAGNOSIS — R3915 Urgency of urination: Secondary | ICD-10-CM | POA: Diagnosis not present

## 2023-11-17 DIAGNOSIS — H903 Sensorineural hearing loss, bilateral: Secondary | ICD-10-CM | POA: Diagnosis not present

## 2023-12-04 ENCOUNTER — Ambulatory Visit: Payer: Self-pay | Admitting: Cardiology

## 2023-12-26 DIAGNOSIS — C44729 Squamous cell carcinoma of skin of left lower limb, including hip: Secondary | ICD-10-CM | POA: Diagnosis not present

## 2024-01-03 ENCOUNTER — Other Ambulatory Visit: Payer: Self-pay | Admitting: Cardiology

## 2024-01-05 DIAGNOSIS — I129 Hypertensive chronic kidney disease with stage 1 through stage 4 chronic kidney disease, or unspecified chronic kidney disease: Secondary | ICD-10-CM | POA: Diagnosis not present

## 2024-01-05 DIAGNOSIS — W11XXXA Fall on and from ladder, initial encounter: Secondary | ICD-10-CM | POA: Diagnosis not present

## 2024-01-05 DIAGNOSIS — R0781 Pleurodynia: Secondary | ICD-10-CM | POA: Diagnosis not present

## 2024-01-07 ENCOUNTER — Ambulatory Visit: Payer: Medicare Other | Attending: Cardiology | Admitting: Cardiology

## 2024-01-07 ENCOUNTER — Encounter: Payer: Self-pay | Admitting: Cardiology

## 2024-01-07 VITALS — BP 134/80 | HR 63 | Resp 16 | Ht 68.0 in | Wt 147.0 lb

## 2024-01-07 DIAGNOSIS — S20211S Contusion of right front wall of thorax, sequela: Secondary | ICD-10-CM | POA: Diagnosis not present

## 2024-01-07 DIAGNOSIS — E7800 Pure hypercholesterolemia, unspecified: Secondary | ICD-10-CM | POA: Insufficient documentation

## 2024-01-07 DIAGNOSIS — Z9889 Other specified postprocedural states: Secondary | ICD-10-CM | POA: Insufficient documentation

## 2024-01-07 DIAGNOSIS — I10 Essential (primary) hypertension: Secondary | ICD-10-CM | POA: Diagnosis not present

## 2024-01-07 DIAGNOSIS — Z8679 Personal history of other diseases of the circulatory system: Secondary | ICD-10-CM | POA: Insufficient documentation

## 2024-01-07 DIAGNOSIS — R002 Palpitations: Secondary | ICD-10-CM | POA: Insufficient documentation

## 2024-01-07 DIAGNOSIS — S20211D Contusion of right front wall of thorax, subsequent encounter: Secondary | ICD-10-CM | POA: Diagnosis not present

## 2024-01-07 MED ORDER — ROSUVASTATIN CALCIUM 10 MG PO TABS: 10.0000 mg | ORAL_TABLET | Freq: Every day | ORAL | 3 refills | Status: DC

## 2024-01-07 MED ORDER — AMLODIPINE BESYLATE 5 MG PO TABS: 5.0000 mg | ORAL_TABLET | Freq: Every day | ORAL | 3 refills | Status: AC

## 2024-01-07 MED ORDER — FLECAINIDE ACETATE 50 MG PO TABS: 50.0000 mg | ORAL_TABLET | Freq: Every day | ORAL | 3 refills | Status: DC

## 2024-01-07 MED ORDER — DICLOFENAC SODIUM 1 % EX GEL: 2.0000 g | Freq: Four times a day (QID) | CUTANEOUS | 0 refills | Status: AC

## 2024-01-07 NOTE — Progress Notes (Signed)
 Cardiology Office Note:  .   Date:  01/07/2024  ID:  Jeffery Garrett, Jeffery Garrett 05/18/1941, MRN [id number] PCP: Shepard Ade, MD  [facility] Providers Cardiologist:  Gordy Bergamo, MD   History of Present Illness: .   Jeffery Garrett is a 83 y.o. 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   Patient recently had a fall from the ladder and has contusion on his right rib, chest x-ray did not reveal any fracture[place]  Otherwise remains asymptomatic, continues to bike for 15 to 20 miles every so often and also continues to run and hike. Labs   External Labs:  PCP labs 05/19/2023:  Serum glucose 90 mg, BUN 22, creatinine 1.1, EGFR 63.9 mL, potassium 4.4, LFTs normal.  Total cholesterol: 165 Triglycerides: 59 HDL: 48 TSH: 1.52  Review of Systems  Cardiovascular:  Positive for palpitations. Negative for chest pain, dyspnea on exertion and leg swelling.   Physical Exam:   VS:  BP 134/80 (BP Location: Left Arm, Patient Position: Sitting, Cuff Size: Normal)   Pulse 63   Resp 16   Ht 5' 8 (1.727 m)   Wt 147 lb (66.7 kg)   SpO2 92%   BMI 22.35 kg/m    Wt Readings from Last 3 Encounters:  01/07/24 147 lb (66.7 kg)  12/03/22 147 lb (66.7 kg)  05/23/22 143 lb 9.6 oz (65.1 kg)    Physical Exam Neck:     Vascular: No carotid bruit or JVD.  Cardiovascular:     Rate and Rhythm: Regular rhythm.     Pulses: Intact distal pulses.     Heart sounds: Normal heart sounds. No murmur heard.    No gallop.  Pulmonary:     Effort: Pulmonary effort is normal.     Breath sounds: Normal breath sounds.  Abdominal:     General: Bowel sounds are normal.     Palpations: Abdomen is soft.  Musculoskeletal:     Right lower leg: No edema.     Left lower leg: No  edema.     Studies Reviewed: .    Echocardiogram 12/07/2020: Left ventricle cavity is normal in size and wall thickness. Normal global wall motion. Normal LV systolic function with EF 54%. Normal diastolic filling pattern. Mild tricuspid regurgitation. No evidence of pulmonary hypertension. No significant change compared to previous study in 2016.  EKG:    EKG Interpretation Date/Time:  Wednesday January 07 2024 09:13:52 EST Ventricular Rate:  65 PR Interval:  220 QRS Duration:  96 QT Interval:  432 QTC Calculation: 449 R Axis:   -19  Text Interpretation: EKG 01/07/2024: Sinus rhythm with first-degree AV block at rate of 65 bpm, normal EKG.  No change from 12/03/2022. Confirmed by Loribeth Katich, Jagadeesh ([id number]) on 01/07/2024 9:19:53 AM    Medications and allergies    No Known Allergies   Current Outpatient Medications:    diclofenac  Sodium (VOLTAREN ) 1 % GEL, Apply 2 g topically 4 (four) times daily., Disp: 300 g, Rfl: 0   SYNTHROID 100 MCG tablet, Take 100 mcg by mouth daily before breakfast. , Disp: , Rfl:    vitamin B-12 (CYANOCOBALAMIN) 500 MCG tablet, Take 500 mcg by mouth daily., Disp: , Rfl:    amLODipine  (NORVASC ) 5 MG tablet, Take 1 tablet (  5 mg total) by mouth daily., Disp: 90 tablet, Rfl: 3   Coenzyme Q10 (CO Q 10) 100 MG CAPS, Take 1 tablet by mouth daily., Disp: , Rfl:    flecainide  (TAMBOCOR ) 50 MG tablet, Take 1 tablet (50 mg total) by mouth daily., Disp: 90 tablet, Rfl: 3   Magnesium Oxide 500 MG CAPS, Take by mouth. occasionally (Patient not taking: Reported on 01/07/2024), Disp: , Rfl:    rosuvastatin  (CRESTOR ) 10 MG tablet, Take 1 tablet (10 mg total) by mouth daily., Disp: 90 tablet, Rfl: 3   vitamin C (ASCORBIC ACID) 500 MG tablet, Take 500 mg by mouth daily. (Patient not taking: Reported on 01/07/2024), Disp: , Rfl:    ASSESSMENT AND PLAN: .      ICD-10-CM   1. Essential hypertension  I10 EKG 12-Lead    amLODipine  (NORVASC ) 5 MG tablet    2. Palpitations  R00.2  flecainide  (TAMBOCOR ) 50 MG tablet    3. S/P ablation of atrial fibrillation  Z98.890 flecainide  (TAMBOCOR ) 50 MG tablet   Z86.79     4. Hypercholesteremia  E78.00 rosuvastatin  (CRESTOR ) 10 MG tablet    5. Rib contusion, right, sequela  S20.211S diclofenac  Sodium (VOLTAREN ) 1 % GEL      1. Essential hypertension (Primary) Blood pressure is presently well-controlled on amlodipine  5 mg daily, continue the same. - EKG 12-Lead - amLODipine  (NORVASC ) 5 MG tablet; Take 1 tablet (5 mg total) by mouth daily.  Dispense: 90 tablet; Refill: 3  2. Palpitations Occasional episodes of palpitations related to PVCs, she has had extensive workup in the past.  He has not had any recurrence of atrial fibrillation.  He continues to remain on very low-dose of flecainide  50 mg once a day.  EKG does not reveal any abnormality, QT interval is normal. - flecainide  (TAMBOCOR ) 50 MG tablet; Take 1 tablet (50 mg total) by mouth daily.  Dispense: 90 tablet; Refill: 3  3. S/P ablation of atrial fibrillation Remote atrial fibrillation ablation >20 years ago or so and has maintained sinus rhythm.  He is not on anticoagulation. - flecainide  (TAMBOCOR ) 50 MG tablet; Take 1 tablet (50 mg total) by mouth daily.  Dispense: 90 tablet; Refill: 3  4. Hypercholesteremia LDL is mildly elevated, he has no other cardiovascular assessment for hypertension and age, continue present dose of Crestor  10 mg daily, he will have his labs done with his PCP again this summer, could consider increasing the dose to 20 mg if LDL is >100.  Otherwise stable from cardiac standpoint I will see him back in a year or sooner if problems. - rosuvastatin  (CRESTOR ) 10 MG tablet; Take 1 tablet (10 mg total) by mouth daily.  Dispense: 90 tablet; Refill: 3  5. Rib contusion, right, sequela - diclofenac  Sodium (VOLTAREN ) 1 % GEL; Apply 2 g topically 4 (four) times daily.  Dispense: 300 g; Refill: 0   Signed,  Gordy Bergamo, MD, [facility] 01/07/2024, 9:41  AM [facility] [address] [place], [place] [address] Phone: [phone]. Fax:  [phone]

## 2024-01-07 NOTE — Patient Instructions (Signed)

## 2024-01-09 DIAGNOSIS — C44729 Squamous cell carcinoma of skin of left lower limb, including hip: Secondary | ICD-10-CM | POA: Diagnosis not present

## 2024-02-05 DIAGNOSIS — R972 Elevated prostate specific antigen [PSA]: Secondary | ICD-10-CM | POA: Diagnosis not present

## 2024-02-17 DIAGNOSIS — M25512 Pain in left shoulder: Secondary | ICD-10-CM | POA: Diagnosis not present

## 2024-03-03 DIAGNOSIS — N1831 Chronic kidney disease, stage 3a: Secondary | ICD-10-CM | POA: Diagnosis not present

## 2024-03-03 DIAGNOSIS — I129 Hypertensive chronic kidney disease with stage 1 through stage 4 chronic kidney disease, or unspecified chronic kidney disease: Secondary | ICD-10-CM | POA: Diagnosis not present

## 2024-03-03 DIAGNOSIS — E785 Hyperlipidemia, unspecified: Secondary | ICD-10-CM | POA: Diagnosis not present

## 2024-03-03 DIAGNOSIS — R972 Elevated prostate specific antigen [PSA]: Secondary | ICD-10-CM | POA: Diagnosis not present

## 2024-03-08 DIAGNOSIS — R351 Nocturia: Secondary | ICD-10-CM | POA: Diagnosis not present

## 2024-03-08 DIAGNOSIS — N401 Enlarged prostate with lower urinary tract symptoms: Secondary | ICD-10-CM | POA: Diagnosis not present

## 2024-03-08 DIAGNOSIS — R3914 Feeling of incomplete bladder emptying: Secondary | ICD-10-CM | POA: Diagnosis not present

## 2024-03-08 DIAGNOSIS — R972 Elevated prostate specific antigen [PSA]: Secondary | ICD-10-CM | POA: Diagnosis not present

## 2024-05-11 DIAGNOSIS — C44729 Squamous cell carcinoma of skin of left lower limb, including hip: Secondary | ICD-10-CM | POA: Diagnosis not present

## 2024-05-24 DIAGNOSIS — I129 Hypertensive chronic kidney disease with stage 1 through stage 4 chronic kidney disease, or unspecified chronic kidney disease: Secondary | ICD-10-CM | POA: Diagnosis not present

## 2024-05-24 DIAGNOSIS — E785 Hyperlipidemia, unspecified: Secondary | ICD-10-CM | POA: Diagnosis not present

## 2024-05-24 DIAGNOSIS — N1831 Chronic kidney disease, stage 3a: Secondary | ICD-10-CM | POA: Diagnosis not present

## 2024-05-24 DIAGNOSIS — R972 Elevated prostate specific antigen [PSA]: Secondary | ICD-10-CM | POA: Diagnosis not present

## 2024-05-24 DIAGNOSIS — E039 Hypothyroidism, unspecified: Secondary | ICD-10-CM | POA: Diagnosis not present

## 2024-05-31 DIAGNOSIS — N1831 Chronic kidney disease, stage 3a: Secondary | ICD-10-CM | POA: Diagnosis not present

## 2024-05-31 DIAGNOSIS — R82998 Other abnormal findings in urine: Secondary | ICD-10-CM | POA: Diagnosis not present

## 2024-05-31 DIAGNOSIS — R972 Elevated prostate specific antigen [PSA]: Secondary | ICD-10-CM | POA: Diagnosis not present

## 2024-05-31 DIAGNOSIS — E039 Hypothyroidism, unspecified: Secondary | ICD-10-CM | POA: Diagnosis not present

## 2024-05-31 DIAGNOSIS — E785 Hyperlipidemia, unspecified: Secondary | ICD-10-CM | POA: Diagnosis not present

## 2024-05-31 DIAGNOSIS — I491 Atrial premature depolarization: Secondary | ICD-10-CM | POA: Diagnosis not present

## 2024-05-31 DIAGNOSIS — Z1339 Encounter for screening examination for other mental health and behavioral disorders: Secondary | ICD-10-CM | POA: Diagnosis not present

## 2024-05-31 DIAGNOSIS — I129 Hypertensive chronic kidney disease with stage 1 through stage 4 chronic kidney disease, or unspecified chronic kidney disease: Secondary | ICD-10-CM | POA: Diagnosis not present

## 2024-05-31 DIAGNOSIS — Z0000 Encounter for general adult medical examination without abnormal findings: Secondary | ICD-10-CM | POA: Diagnosis not present

## 2024-05-31 DIAGNOSIS — N401 Enlarged prostate with lower urinary tract symptoms: Secondary | ICD-10-CM | POA: Diagnosis not present

## 2024-05-31 DIAGNOSIS — Z1331 Encounter for screening for depression: Secondary | ICD-10-CM | POA: Diagnosis not present

## 2024-07-06 DIAGNOSIS — C44729 Squamous cell carcinoma of skin of left lower limb, including hip: Secondary | ICD-10-CM | POA: Diagnosis not present

## 2024-07-06 DIAGNOSIS — Z86018 Personal history of other benign neoplasm: Secondary | ICD-10-CM | POA: Diagnosis not present

## 2024-07-06 DIAGNOSIS — Z85828 Personal history of other malignant neoplasm of skin: Secondary | ICD-10-CM | POA: Diagnosis not present

## 2024-07-06 DIAGNOSIS — Z08 Encounter for follow-up examination after completed treatment for malignant neoplasm: Secondary | ICD-10-CM | POA: Diagnosis not present

## 2024-07-06 DIAGNOSIS — R002 Palpitations: Secondary | ICD-10-CM | POA: Diagnosis not present

## 2024-07-06 DIAGNOSIS — Z9889 Other specified postprocedural states: Secondary | ICD-10-CM | POA: Diagnosis not present

## 2024-07-30 DIAGNOSIS — D123 Benign neoplasm of transverse colon: Secondary | ICD-10-CM | POA: Diagnosis not present

## 2024-07-30 DIAGNOSIS — K5730 Diverticulosis of large intestine without perforation or abscess without bleeding: Secondary | ICD-10-CM | POA: Diagnosis not present

## 2024-07-30 DIAGNOSIS — Z09 Encounter for follow-up examination after completed treatment for conditions other than malignant neoplasm: Secondary | ICD-10-CM | POA: Diagnosis not present

## 2024-07-30 DIAGNOSIS — D122 Benign neoplasm of ascending colon: Secondary | ICD-10-CM | POA: Diagnosis not present

## 2024-07-30 DIAGNOSIS — Z860101 Personal history of adenomatous and serrated colon polyps: Secondary | ICD-10-CM | POA: Diagnosis not present

## 2024-08-04 DIAGNOSIS — D122 Benign neoplasm of ascending colon: Secondary | ICD-10-CM | POA: Diagnosis not present

## 2024-08-04 DIAGNOSIS — D123 Benign neoplasm of transverse colon: Secondary | ICD-10-CM | POA: Diagnosis not present

## 2024-08-31 DIAGNOSIS — R262 Difficulty in walking, not elsewhere classified: Secondary | ICD-10-CM | POA: Diagnosis not present

## 2024-08-31 DIAGNOSIS — M170 Bilateral primary osteoarthritis of knee: Secondary | ICD-10-CM | POA: Diagnosis not present

## 2024-08-31 DIAGNOSIS — M25561 Pain in right knee: Secondary | ICD-10-CM | POA: Diagnosis not present

## 2024-08-31 DIAGNOSIS — M25562 Pain in left knee: Secondary | ICD-10-CM | POA: Diagnosis not present

## 2024-09-09 DIAGNOSIS — Z23 Encounter for immunization: Secondary | ICD-10-CM | POA: Diagnosis not present

## 2024-09-14 DIAGNOSIS — M170 Bilateral primary osteoarthritis of knee: Secondary | ICD-10-CM | POA: Diagnosis not present

## 2024-09-14 DIAGNOSIS — R262 Difficulty in walking, not elsewhere classified: Secondary | ICD-10-CM | POA: Diagnosis not present

## 2024-09-14 DIAGNOSIS — M25561 Pain in right knee: Secondary | ICD-10-CM | POA: Diagnosis not present

## 2024-09-14 DIAGNOSIS — M25562 Pain in left knee: Secondary | ICD-10-CM | POA: Diagnosis not present

## 2024-09-21 DIAGNOSIS — M4316 Spondylolisthesis, lumbar region: Secondary | ICD-10-CM | POA: Diagnosis not present

## 2024-09-21 DIAGNOSIS — M47816 Spondylosis without myelopathy or radiculopathy, lumbar region: Secondary | ICD-10-CM | POA: Diagnosis not present

## 2024-09-28 DIAGNOSIS — M25561 Pain in right knee: Secondary | ICD-10-CM | POA: Diagnosis not present

## 2024-09-28 DIAGNOSIS — M170 Bilateral primary osteoarthritis of knee: Secondary | ICD-10-CM | POA: Diagnosis not present

## 2024-09-28 DIAGNOSIS — M25562 Pain in left knee: Secondary | ICD-10-CM | POA: Diagnosis not present

## 2024-09-28 DIAGNOSIS — R262 Difficulty in walking, not elsewhere classified: Secondary | ICD-10-CM | POA: Diagnosis not present

## 2024-09-29 DIAGNOSIS — M47816 Spondylosis without myelopathy or radiculopathy, lumbar region: Secondary | ICD-10-CM | POA: Diagnosis not present

## 2024-10-13 DIAGNOSIS — R262 Difficulty in walking, not elsewhere classified: Secondary | ICD-10-CM | POA: Diagnosis not present

## 2024-10-13 DIAGNOSIS — M170 Bilateral primary osteoarthritis of knee: Secondary | ICD-10-CM | POA: Diagnosis not present

## 2024-10-13 DIAGNOSIS — M25562 Pain in left knee: Secondary | ICD-10-CM | POA: Diagnosis not present

## 2024-10-13 DIAGNOSIS — M25561 Pain in right knee: Secondary | ICD-10-CM | POA: Diagnosis not present

## 2024-10-26 DIAGNOSIS — M25562 Pain in left knee: Secondary | ICD-10-CM | POA: Diagnosis not present

## 2024-10-26 DIAGNOSIS — M170 Bilateral primary osteoarthritis of knee: Secondary | ICD-10-CM | POA: Diagnosis not present

## 2024-10-26 DIAGNOSIS — R262 Difficulty in walking, not elsewhere classified: Secondary | ICD-10-CM | POA: Diagnosis not present

## 2024-10-26 DIAGNOSIS — M25561 Pain in right knee: Secondary | ICD-10-CM | POA: Diagnosis not present

## 2024-11-03 ENCOUNTER — Emergency Department (HOSPITAL_BASED_OUTPATIENT_CLINIC_OR_DEPARTMENT_OTHER)
Admission: EM | Admit: 2024-11-03 | Discharge: 2024-11-03 | Disposition: A | Discharge status: Home / Self Care | Attending: Emergency Medicine | Admitting: Emergency Medicine

## 2024-11-03 ENCOUNTER — Encounter (HOSPITAL_BASED_OUTPATIENT_CLINIC_OR_DEPARTMENT_OTHER): Payer: Self-pay

## 2024-11-03 ENCOUNTER — Other Ambulatory Visit: Payer: Self-pay

## 2024-11-03 ENCOUNTER — Emergency Department (HOSPITAL_BASED_OUTPATIENT_CLINIC_OR_DEPARTMENT_OTHER)

## 2024-11-03 DIAGNOSIS — W268XXA Contact with other sharp object(s), not elsewhere classified, initial encounter: Secondary | ICD-10-CM | POA: Diagnosis not present

## 2024-11-03 DIAGNOSIS — S61412A Laceration without foreign body of left hand, initial encounter: Secondary | ICD-10-CM | POA: Diagnosis not present

## 2024-11-03 DIAGNOSIS — S6992XA Unspecified injury of left wrist, hand and finger(s), initial encounter: Secondary | ICD-10-CM | POA: Diagnosis present

## 2024-11-03 DIAGNOSIS — Z23 Encounter for immunization: Secondary | ICD-10-CM | POA: Insufficient documentation

## 2024-11-03 DIAGNOSIS — M1812 Unilateral primary osteoarthritis of first carpometacarpal joint, left hand: Secondary | ICD-10-CM | POA: Diagnosis not present

## 2024-11-03 MED ORDER — CEFAZOLIN SODIUM 1 G IM
1.0000 g | Freq: Once | INTRAMUSCULAR | Status: AC
  Administered 2024-11-03: 1 g via INTRAMUSCULAR
  Filled 2024-11-03: qty 10

## 2024-11-03 MED ORDER — LIDOCAINE-EPINEPHRINE-TETRACAINE (LET) TOPICAL GEL
3.0000 mL | Freq: Once | TOPICAL | Status: AC
  Administered 2024-11-03: 3 mL via TOPICAL
  Filled 2024-11-03: qty 3

## 2024-11-03 MED ORDER — TETANUS-DIPHTH-ACELL PERTUSSIS 5-2-15.5 LF-MCG/0.5 IM SUSP
0.5000 mL | Freq: Once | INTRAMUSCULAR | Status: AC
  Administered 2024-11-03: 0.5 mL via INTRAMUSCULAR
  Filled 2024-11-03: qty 0.5

## 2024-11-03 MED ORDER — CEPHALEXIN 500 MG PO CAPS: 500.0000 mg | ORAL_CAPSULE | Freq: Four times a day (QID) | ORAL | 0 refills | Status: AC

## 2024-11-03 NOTE — Discharge Instructions (Signed)
 You were seen in the Emergency Department for a laceration to the left hand The x-ray did not show any broken bones The exposed tendon appeared intact The wound was cleaned and reapproximated with combination of sutures and Steri-Strips The sutures will need to be removed in 8 to 10 days This can be done here in the emergency department at an urgent care or at your doctor's office We have called in a prescription for antibiotics for you to pick up from your pharmacy begin taking as directed to prevent infection in your hand Follow-up with Dr. Murrell in the hand office Return to the Emergency Department for evidence of infection such as severe pain streaking redness draining pus or fevers

## 2024-11-03 NOTE — ED Notes (Signed)
 EDP notified of bone exposure on hand. No new orders at this time. Pt placed in ED room

## 2024-11-03 NOTE — ED Notes (Signed)
 Pt. Cut his L hand on a screw yesterday causing the skin to tear and it is still oozing blood and the wound is open.

## 2024-11-03 NOTE — ED Triage Notes (Addendum)
 Skin tear to back of L hand after scraping against screw in wall. Bone exposed

## 2024-11-03 NOTE — ED Provider Notes (Signed)
 [facility] Provider Note   CSN: [id number] Arrival date & time: 11/03/24  1114     Patient presents with: Laceration   Jeffery Garrett is a 83 y.o. male.  Who presents to the ED for a left hand laceration.  Patient sustained a laceration/skin avulsion to the dorsum of left hand yesterday afternoon while hanging Christmas decorations.  He was attempting to hang a Christmas wreath on a chimney when he slipped and struck the back of his hand on a screw in the wall.  No other injuries.  His wife promptly washed the wound out yesterday at home    Laceration      Prior to Admission medications   Medication Sig Start Date End Date Taking? Authorizing Provider  cephALEXin (KEFLEX) 500 MG capsule Take 1 capsule (500 mg total) by mouth 4 (four) times daily. 11/03/24  Yes Pamella Ozell LABOR, DO  amLODipine  (NORVASC ) 5 MG tablet Take 1 tablet (5 mg total) by mouth daily. 01/07/24   Ladona Heinz, MD  Coenzyme Q10 (CO Q 10) 100 MG CAPS Take 1 tablet by mouth daily.    [provider]  diclofenac  Sodium (VOLTAREN ) 1 % GEL Apply 2 g topically 4 (four) times daily. 01/07/24   Ladona Heinz, MD  flecainide  (TAMBOCOR ) 50 MG tablet Take 1 tablet (50 mg total) by mouth daily. 01/07/24   Ladona Heinz, MD  Magnesium Oxide 500 MG CAPS Take by mouth. occasionally Patient not taking: Reported on 01/07/2024    [provider]  rosuvastatin  (CRESTOR ) 10 MG tablet Take 1 tablet (10 mg total) by mouth daily. 01/07/24   Ladona Heinz, MD  SYNTHROID 100 MCG tablet Take 100 mcg by mouth daily before breakfast.  08/15/14   [provider]  vitamin B-12 (CYANOCOBALAMIN) 500 MCG tablet Take 500 mcg by mouth daily.    [provider]  vitamin C (ASCORBIC ACID) 500 MG tablet Take 500 mg by mouth daily. Patient not taking: Reported on 01/07/2024    [provider]    Allergies: Patient has no known allergies.    Review of Systems  Updated Vital  Signs BP (!) 142/79   Pulse 73   Temp 97.9 F (36.6 C)   Resp 18   SpO2 99%   Physical Exam Vitals and nursing note reviewed.  HENT:     Head: Normocephalic and atraumatic.  Cardiovascular:     Rate and Rhythm: Normal rate and regular rhythm.  Pulmonary:     Effort: Pulmonary effort is normal.     Breath sounds: Normal breath sounds.  Musculoskeletal:     Comments: Deep skin avulsion noted for dorsal aspect of left hand approximately 7 cm with exposed extensor digitorum tendon of middle digit Tendon intact able to fully flex and extend middle finger Full active range of motion in all fingers with sensation intact light touch throughout   Skin:    General: Skin is warm and dry.  Neurological:     Mental Status: He is alert.  Psychiatric:        Mood and Affect: Mood normal.     (all labs ordered are listed, but only abnormal results are displayed) Labs Reviewed - No data to display  EKG: None  Radiology: DG Hand Complete Left Result Date: 11/03/2024 CLINICAL DATA:  Hand injury, laceration EXAM: LEFT HAND - COMPLETE 3+ VIEW COMPARISON:  None Available. FINDINGS: Normal alignment without acute osseous finding or fracture. 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 Similar degenerative changes at the left first [facility] joint at the wrist as well. Soft tissue injury over the dorsum of the hand with subcutaneous air noted related to laceration. No large radiopaque foreign body. IMPRESSION: 1. Soft tissue injury over the dorsum of the hand. 2. No acute osseous finding. 3. Degenerative changes as above. Electronically Signed   By: CHRISTELLA.  Shick M.D.   On: 11/03/2024 12:30     .Laceration Repair  Date/Time: 11/03/2024 2:04 PM  Performed by: Pamella Ozell LABOR, DO Authorized by: Pamella Ozell LABOR, DO   Consent:    Consent obtained:  Verbal   Consent given by:  Patient   Risks discussed:  Infection, pain, need for  additional repair, nerve damage and poor wound healing   Alternatives discussed:  No treatment Universal protocol:    Immediately prior to procedure, a time out was called: yes     Patient identity confirmed:  Verbally with patient Anesthesia:    Anesthesia method:  Topical application   Topical anesthetic:  LET Laceration details:    Location:  Hand   Hand location:  L hand, dorsum   Length (cm):  7   Depth (mm):  0.5 Exploration:    Wound exploration: entire depth of wound visualized     Wound extent: no tendon damage     Contaminated: no   Treatment:    Area cleansed with:  Saline and povidone-iodine   Amount of cleaning:  Extensive   Irrigation solution:  Tap water   Visualized foreign bodies/material removed: no   Skin repair:    Repair method:  Sutures and Steri-Strips   Suture size:  5-0   Suture material:  Prolene   Suture technique:  Simple interrupted   Number of sutures:  3   Number of Steri-Strips:  5 Approximation:    Approximation:  Close Repair type:    Repair type:  Simple Post-procedure details:    Dressing:  Antibiotic ointment, non-adherent dressing and sterile dressing   Procedure completion:  Tolerated    Medications Ordered in the ED  lidocaine-EPINEPHrine-tetracaine (LET) topical gel (has no administration in time range)  Tdap (ADACEL) injection 0.5 mL (0.5 mLs Intramuscular Given 11/03/24 1151)  ceFAZolin (ANCEF) injection 1 g (1 g Intramuscular Given 11/03/24 1247)                                    Medical Decision Making 83 year old male with history as above presented to the ED for laceration/skin avulsion of dorsal aspect of left hand.  There is some exposed tendon (extensor digitorum) but no visual evidence of tendon involvement or exam features to suggest tendon compromise.  Wound was copiously cleaned and irrigated repaired with commendation of Prolene sutures and Steri-Strips as documented above.  Patient tolerated procedure well.  Tdap  and Ancef given here.  Will discharge with short course of Keflex to prevent infection and instruct for and follow-up in the office.  Return precautions will be worrisome for infection of the hand were discussed with he and his wife in detail and he understands what to come back to the ED for.  Amount and/or Complexity of Data Reviewed Radiology: ordered.  Risk Prescription drug management.        Final diagnoses:  Laceration of left hand without foreign body, initial encounter    ED Discharge  Orders          Ordered    cephALEXin (KEFLEX) 500 MG capsule  4 times daily        11/03/24 1412               Pamella Ozell LABOR, DO 11/03/24 1413

## 2024-11-05 DIAGNOSIS — X32XXXD Exposure to sunlight, subsequent encounter: Secondary | ICD-10-CM | POA: Diagnosis not present

## 2024-11-05 DIAGNOSIS — Z85828 Personal history of other malignant neoplasm of skin: Secondary | ICD-10-CM | POA: Diagnosis not present

## 2024-11-05 DIAGNOSIS — L570 Actinic keratosis: Secondary | ICD-10-CM | POA: Diagnosis not present

## 2024-11-05 DIAGNOSIS — Z08 Encounter for follow-up examination after completed treatment for malignant neoplasm: Secondary | ICD-10-CM | POA: Diagnosis not present

## 2024-12-24 ENCOUNTER — Other Ambulatory Visit: Payer: Self-pay

## 2024-12-24 DIAGNOSIS — E7800 Pure hypercholesterolemia, unspecified: Secondary | ICD-10-CM

## 2024-12-25 ENCOUNTER — Other Ambulatory Visit: Payer: Self-pay | Admitting: Cardiology

## 2024-12-25 DIAGNOSIS — Z9889 Other specified postprocedural states: Secondary | ICD-10-CM

## 2024-12-25 DIAGNOSIS — R002 Palpitations: Secondary | ICD-10-CM

## 2024-12-29 ENCOUNTER — Telehealth: Payer: Self-pay | Admitting: Cardiology

## 2024-12-29 ENCOUNTER — Other Ambulatory Visit: Payer: Self-pay | Admitting: Cardiology

## 2024-12-29 DIAGNOSIS — E7800 Pure hypercholesterolemia, unspecified: Secondary | ICD-10-CM

## 2024-12-29 MED ORDER — ROSUVASTATIN CALCIUM 10 MG PO TABS: 10.0000 mg | ORAL_TABLET | Freq: Every day | ORAL | 0 refills | Status: DC

## 2024-12-29 NOTE — Telephone Encounter (Signed)
 REFILL SENT

## 2024-12-29 NOTE — Telephone Encounter (Signed)
*  STAT* If patient is at the pharmacy, call can be transferred to refill team.   1. Which medications need to be refilled? (please list name of each medication and dose if known)    rosuvastatin (CRESTOR) 10 MG tablet    2. Which pharmacy/location (including street and city if local pharmacy) is medication to be sent to?  [id number] - [place], [place] - [address] AT [address]    3. Do they need a 30 day or 90 day supply? 90

## 2024-12-31 ENCOUNTER — Other Ambulatory Visit: Payer: Self-pay | Admitting: Cardiology

## 2024-12-31 DIAGNOSIS — E7800 Pure hypercholesterolemia, unspecified: Secondary | ICD-10-CM

## 2024-12-31 MED ORDER — ROSUVASTATIN CALCIUM 10 MG PO TABS: 10.0000 mg | ORAL_TABLET | Freq: Every day | ORAL | 3 refills | Status: AC

## 2024-12-31 NOTE — Progress Notes (Signed)
"    ICD-10-CM   1. Hypercholesteremia  E78.00 rosuvastatin  (CRESTOR ) 10 MG tablet     Meds ordered this encounter  Medications   rosuvastatin  (CRESTOR ) 10 MG tablet    Sig: Take 1 tablet (10 mg total) by mouth daily.    Dispense:  90 tablet    Refill:  3    1ST ATTEMPT, PT IS DUE FOR AN APPT, PLEASE HAVE PT CALL TO SCHEDULE.    "
